# Patient Record
Sex: Female | Born: 1975
Health system: Southern US, Community
[De-identification: ages and names within clinical notes are randomized; demographics above are authoritative.]

## PROBLEM LIST (undated history)

## (undated) DIAGNOSIS — J45909 Unspecified asthma, uncomplicated: Secondary | ICD-10-CM

## (undated) DIAGNOSIS — B019 Varicella without complication: Secondary | ICD-10-CM

## (undated) DIAGNOSIS — R7611 Nonspecific reaction to tuberculin skin test without active tuberculosis: Secondary | ICD-10-CM

## (undated) HISTORY — DX: Varicella without complication: B01.9

## (undated) HISTORY — DX: Nonspecific reaction to tuberculin skin test without active tuberculosis: R76.11

## (undated) HISTORY — DX: Unspecified asthma, uncomplicated: J45.909

---

## 2002-10-26 ENCOUNTER — Ambulatory Visit (HOSPITAL_COMMUNITY): Admission: RE | Admit: 2002-10-26 | Discharge: 2002-10-26 | Payer: Self-pay | Admitting: *Deleted

## 2002-12-28 ENCOUNTER — Ambulatory Visit (HOSPITAL_COMMUNITY): Admission: RE | Admit: 2002-12-28 | Discharge: 2002-12-28 | Payer: Self-pay | Admitting: *Deleted

## 2003-02-21 ENCOUNTER — Encounter: Admission: RE | Admit: 2003-02-21 | Discharge: 2003-02-21 | Payer: Self-pay | Admitting: *Deleted

## 2003-03-05 ENCOUNTER — Encounter: Admission: RE | Admit: 2003-03-05 | Discharge: 2003-03-05 | Payer: Self-pay | Admitting: *Deleted

## 2003-03-07 ENCOUNTER — Ambulatory Visit (HOSPITAL_COMMUNITY): Admission: RE | Admit: 2003-03-07 | Discharge: 2003-03-07 | Payer: Self-pay | Admitting: *Deleted

## 2003-03-07 ENCOUNTER — Encounter: Admission: RE | Admit: 2003-03-07 | Discharge: 2003-03-07 | Payer: Self-pay | Admitting: *Deleted

## 2003-03-15 ENCOUNTER — Encounter: Admission: RE | Admit: 2003-03-15 | Discharge: 2003-03-15 | Payer: Self-pay | Admitting: *Deleted

## 2003-03-21 ENCOUNTER — Encounter: Admission: RE | Admit: 2003-03-21 | Discharge: 2003-03-21 | Payer: Self-pay | Admitting: *Deleted

## 2003-03-29 ENCOUNTER — Encounter: Admission: RE | Admit: 2003-03-29 | Discharge: 2003-03-29 | Payer: Self-pay | Admitting: *Deleted

## 2003-04-02 ENCOUNTER — Inpatient Hospital Stay (HOSPITAL_COMMUNITY): Admission: RE | Admit: 2003-04-02 | Discharge: 2003-04-02 | Payer: Self-pay | Admitting: *Deleted

## 2003-04-04 ENCOUNTER — Encounter: Admission: RE | Admit: 2003-04-04 | Discharge: 2003-04-04 | Payer: Self-pay | Admitting: *Deleted

## 2003-04-05 ENCOUNTER — Inpatient Hospital Stay (HOSPITAL_COMMUNITY): Admission: AD | Admit: 2003-04-05 | Discharge: 2003-04-08 | Payer: Self-pay | Admitting: *Deleted

## 2003-04-09 ENCOUNTER — Encounter: Admission: RE | Admit: 2003-04-09 | Discharge: 2003-05-09 | Payer: Self-pay | Admitting: *Deleted

## 2005-02-07 IMAGING — US US OB FOLLOW-UP
1 series · 13 of 28 positions shown · non-contrast
Comparison: none

CLINICAL DATA: Reevaluate growth.

[Series 1: unknown · 0.32mm/px · 13 of 34 slices shown]
[im 2/34]
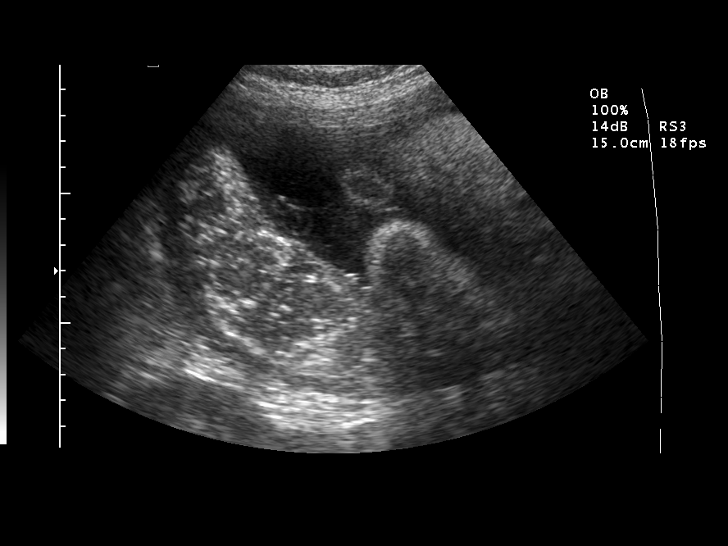
[im 4/34]
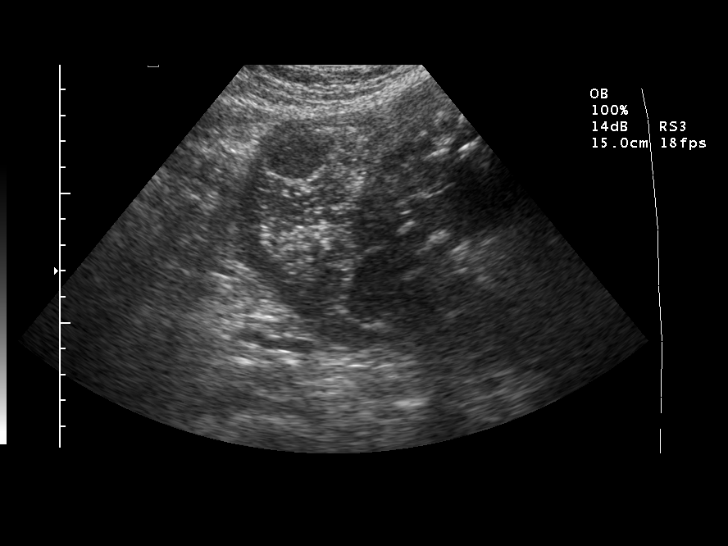
[im 7/34]
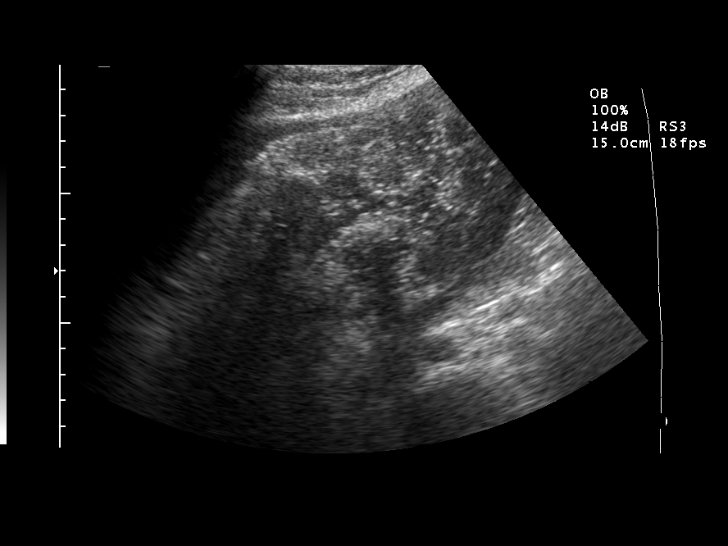
[im 9/34]
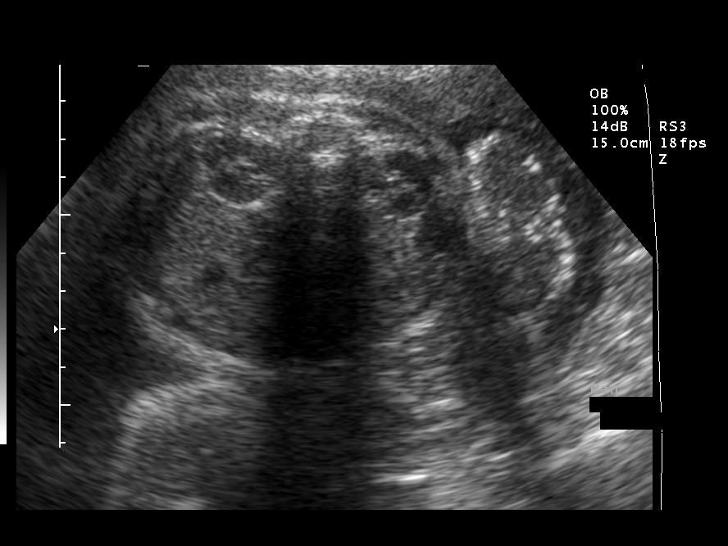
[im 12/34]
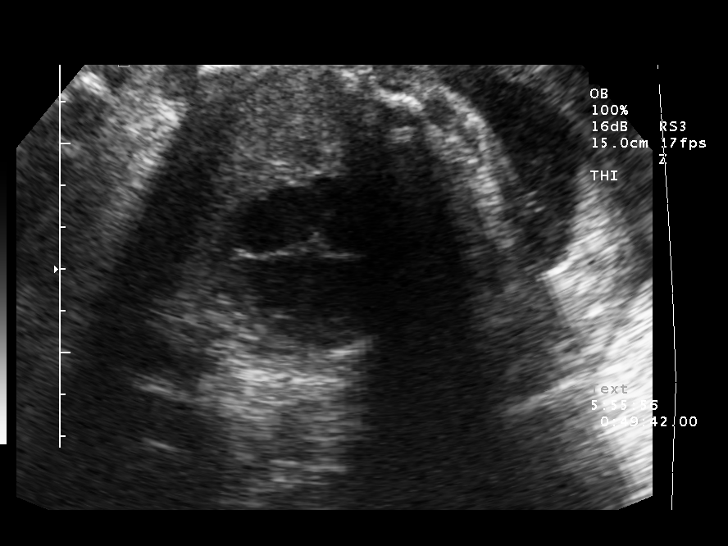
[im 14/34]
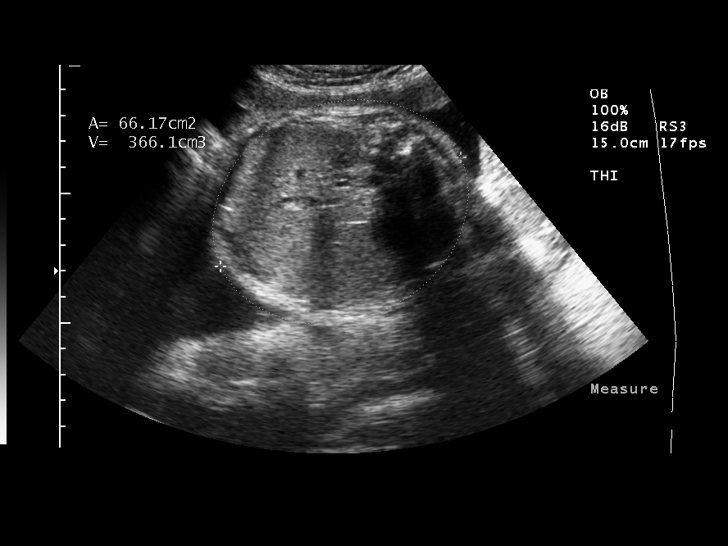
[im 18/34]
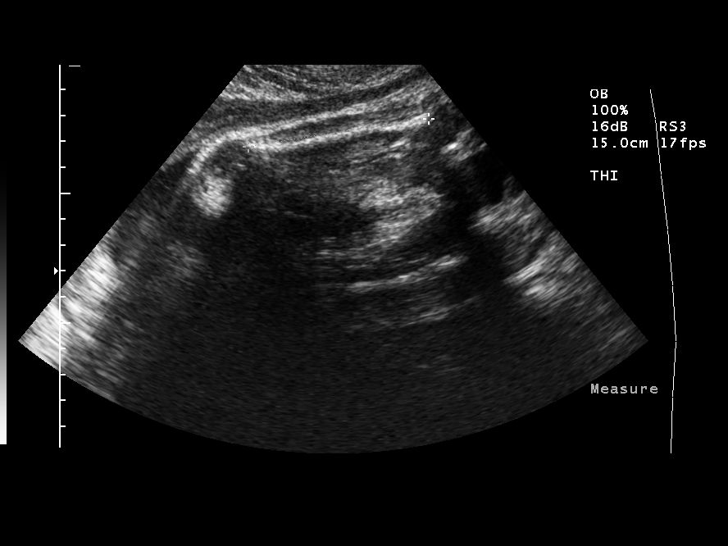
[im 20/34]
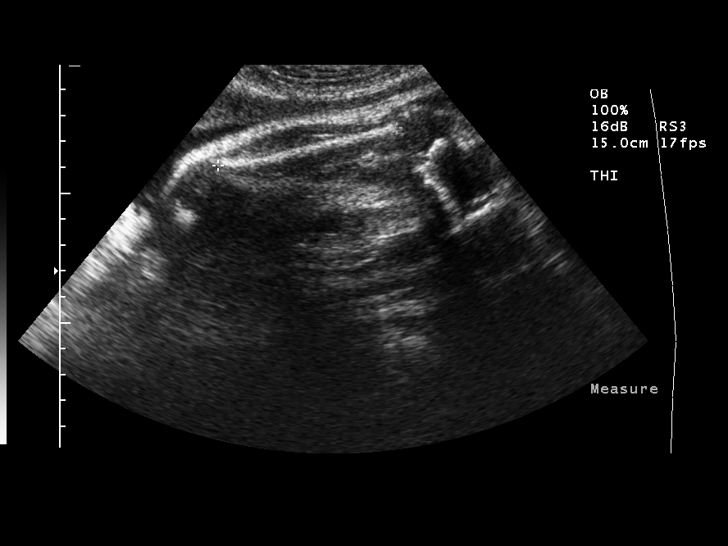
[im 23/34]
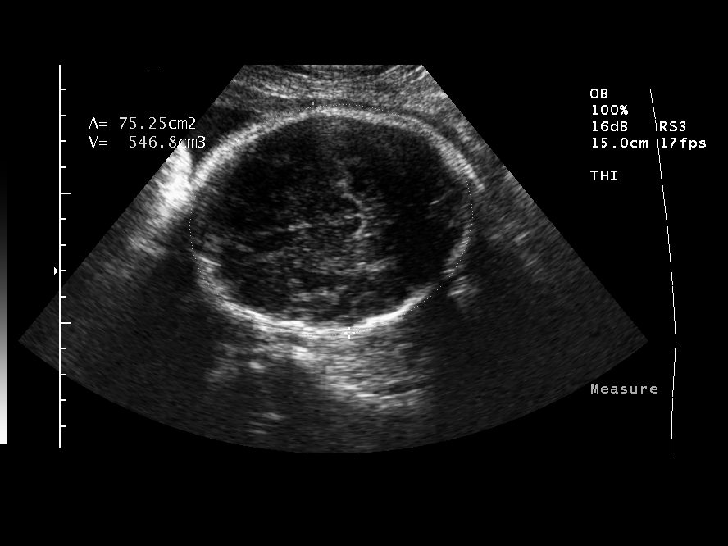
[im 25/34]
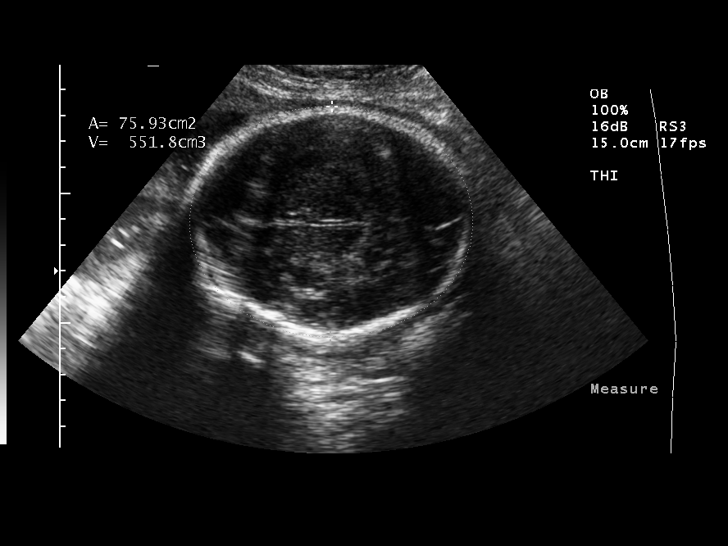
[im 27/34]
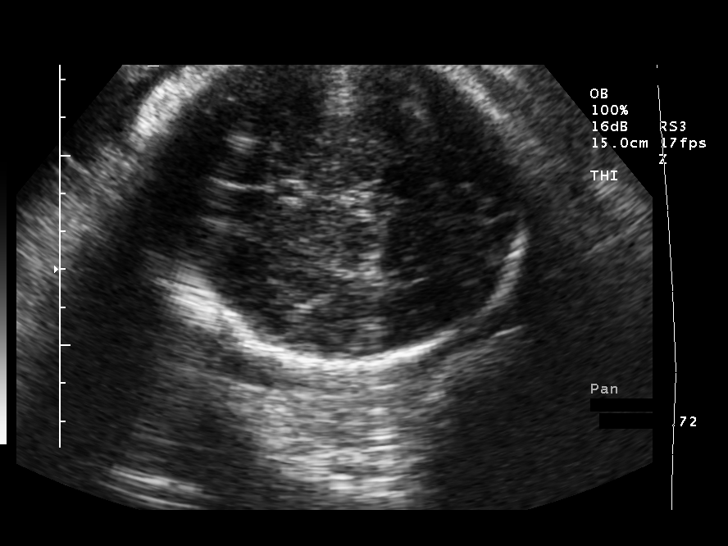
[im 30/34]
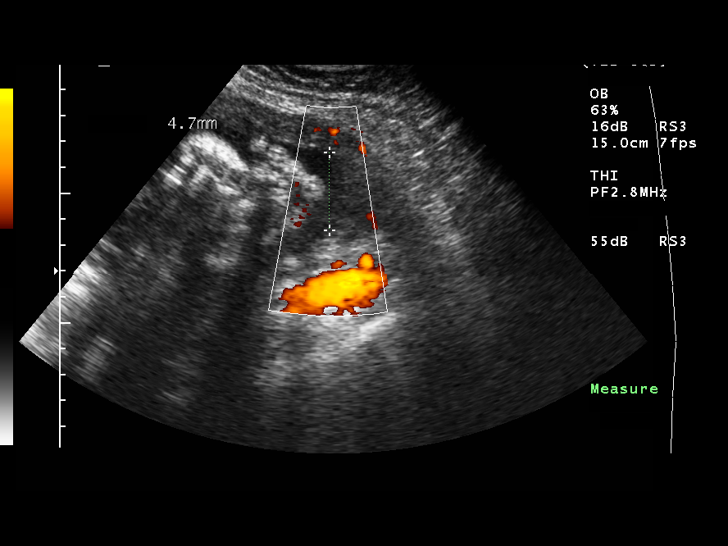
[im 32/34]
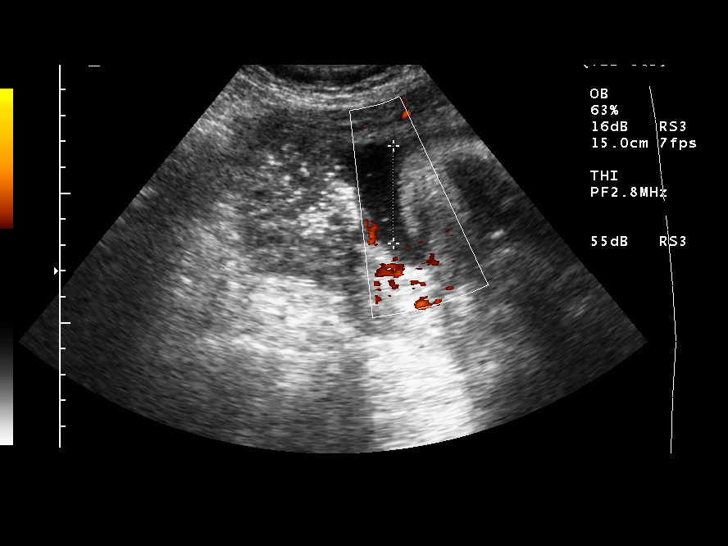

[13 of 28 positions shown; findings below may reference images not displayed]

OBSTETRICAL ULTRASOUND RE-EVALUATION
 Number of Fetuses:  1
 Heart Rate:  142
 Movement:  Yes
 Breathing:  Yes
 Presentation:  Cephalic
 Placental Location:  Fundal, posterior
 Grade:  III
 Previa:  No
 Amniotic Fluid (subjective):  Normal
 Amniotic Fluid (objective):  14.3 cm AFI (5th -95th%ile =   7.5 – 24.4 cm for 37 wks)

 FETAL BIOMETRY
 BPD:  8.7 cm   35 w 0 d
 HC:  31.2 cm   34 w 6 d
 AC:  29.2 cm   33 w 1 d
 FL:  7.1 cm   36 w 1 d

 Mean GA:  34 w 6 d
 Assigned GA:  36 w 6 d  (LMP)
 BPD/OFD:  .82 (0.70 – 0.86),  FL/BPD:  .81 (0.71 – 0.87), FL/AC:  .24 (0.20 – 0.24), HC/AC:  1.07 (.93 – 1.11)
 EFW:  7390 g (H) 10th – 25th%ile (5756 – 9069 g) For 37 wks

 FETAL ANATOMY
 Lateral Ventricles:    Visualized 
 Thalami/CSP:  Visualized 
 Posterior Fossa:  Previously seen 
 Nuchal Region:  N/A
 Spine:  Previously seen 
 4 Chamber Heart on Left:  Previously seen 
 Stomach on Left:  Visualized 
 3 Vessel Cord:  Visualized 
 Cord Insertion Site:  Previously seen 
 Kidneys:  Visualized 
 Bladder:  Visualized 
 Extremities:  Previously seen 

 Evaluation limited by:  Advanced gestational age 

 MATERNAL FINDINGS
 Cervix:  Not evaluated
IMPRESSION: Single intrauterine pregnancy demonstrating an estimated gestational age by ultrasound of 34 weeks and 6 days.  The abdominal circumference is smaller than the remaining gestational indicators with an absolute measurement of 29.2 cm (33 weeks 1 day).  The FL/AC ratio is at the upper limits of normal at .24 and the estimated fetal weight is 7390 grams.  This is between the 10th and 25th percentile, but closer to the lower end of the spectrum while on the prior exam the estimated fetal weight was between the 25th and 50th percentile, but close to the mid range suggesting the possibility of a greater than 25% fall-off in weight since the previous exam.  For this reason Doppler evaluation was performed.  
 Subjectively and quantitatively normal amniotic fluid. 
 No late developing fetal anatomic abnormalities are noted.
 DOPPLER ULTRASOUND OF FETUS

 Umbilical Artery S/D Ratio: 3.41  (NL < 3.23)

 Middle Cerebral Artery PI:  2.15  (NL > 1.15)
IMPRESSION: Slightly low umbilical artery S/D ratio with a normal middle cerebral artery doppler exam.
 Because of today’s findings, these study results were called to the [facility] Surend[facility].

## 2014-01-12 ENCOUNTER — Ambulatory Visit (INDEPENDENT_AMBULATORY_CARE_PROVIDER_SITE_OTHER): Payer: BC Managed Care – PPO | Admitting: Internal Medicine

## 2014-01-12 ENCOUNTER — Encounter: Payer: Self-pay | Admitting: Internal Medicine

## 2014-01-12 VITALS — BP 139/93 | HR 74 | Ht 62.0 in | Wt 144.7 lb

## 2014-01-12 DIAGNOSIS — R079 Chest pain, unspecified: Secondary | ICD-10-CM

## 2014-01-12 DIAGNOSIS — R0789 Other chest pain: Secondary | ICD-10-CM

## 2014-01-12 NOTE — Progress Notes (Signed)
    OFFICE NOTE  Chief Complaint:  Chest pain  Primary Care Physician: Jarrett SohoWharton, Courtney, PA-C  HPI:  Sherri Crosby is a pleasant 38 year old [place] female who is working at a [employer]. 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 She notes that when she goes in and out of the freezer that she comes out and has difficulty breathing and sharp chest pains mostly with cold weather. She also notes that when lifting boxes she gets some heaviness in her chest. She is noted sharp pains when vacuuming and sometimes does go to her back between the shoulder blades. The symptoms are very short-lived but do seem to improve with rest. There is a family history of heart disease in her mother who died of heart disease and her father had cirrhosis. She has no other active medical problems that are known and is not on medications.  PMHx:  History reviewed. No pertinent past medical history.  History reviewed. No pertinent past surgical history.  FAMHx:  Family History  Problem Relation Age of Onset  . Cirrhosis Father   . CAD Mother     SOCHx:   reports that she has never smoked. She has never used smokeless tobacco. She reports that she does not drink alcohol or use illicit drugs.  ALLERGIES:  Allergies  Allergen Reactions  . Avocado     ROS: A comprehensive review of systems was negative except for: Cardiovascular: positive for chest pain  HOME MEDS: No current outpatient prescriptions on file.   No current facility-administered medications for this visit.    LABS/IMAGING: No results found for this or any previous visit (from the past 48 hour(s)). No results found.  VITALS: BP 139/93  Pulse 74  Ht 5\' 2"  (1.575 m)  Wt 144 lb 11.2 oz (65.635 kg)  BMI 26.46 kg/m2  EXAM: General appearance: alert and no distress Neck: no carotid bruit and no JVD Lungs:  clear to auscultation bilaterally Heart: regular rate and rhythm, S1, S2 normal, no murmur, click, rub or gallop Abdomen: soft, non-tender; bowel sounds normal; no masses,  no organomegaly Extremities: extremities normal, atraumatic, no cyanosis or edema Pulses: 2+ and symmetric Skin: Skin color, texture, turgor normal. No rashes or lesions Neurologic: Grossly normal Psych: Normal  EKG: Normal sinus rhythm with sinus arrhythmia at 74  ASSESSMENT: 1. Atypical chest pain with a pleuritic component  PLAN: 1.   Sherri Crosby is describing a chest pain which has some relation to exertion but also is worse with cold weather and has a pleuritic component. This could indicate airway disease, possibly a reactive airway disease. She denies any wheezing or cough. Her pain does radiate somewhat to her back. I would recommend a treadmill exercise stress test to further evaluate for ischemia. We will contact her with the results of that study. If negative, further workup for possible pleuritic or chest wall pain is warranted.  Thanks so much for the kind referral.  Chrystie NoseKenneth C. Hilty, MD, [facility] Attending Cardiologist [facility]  HILTY,Kenneth C 01/12/2014, 5:00 PM

## 2014-01-12 NOTE — Patient Instructions (Signed)
Your physician has requested that you have an exercise tolerance test. For further information please visit [email or web address]. Please also follow instruction sheet, as given. >> we will call you with the test results   Your physician recommends that you schedule a follow-up appointment as needed.

## 2014-02-15 ENCOUNTER — Telehealth (HOSPITAL_COMMUNITY): Payer: Self-pay

## 2014-02-15 NOTE — Telephone Encounter (Signed)
Encounter complete. 

## 2014-02-16 ENCOUNTER — Telehealth (HOSPITAL_COMMUNITY): Payer: Self-pay

## 2014-02-16 NOTE — Telephone Encounter (Signed)
Encounter complete. 

## 2014-02-20 ENCOUNTER — Encounter (HOSPITAL_COMMUNITY): Payer: BC Managed Care – PPO

## 2014-12-11 ENCOUNTER — Ambulatory Visit: Payer: Self-pay | Admitting: Adult Health

## 2015-07-18 ENCOUNTER — Encounter: Payer: Self-pay | Admitting: Adult Health

## 2015-07-18 ENCOUNTER — Ambulatory Visit (INDEPENDENT_AMBULATORY_CARE_PROVIDER_SITE_OTHER): Payer: BLUE CROSS/BLUE SHIELD | Admitting: Adult Health

## 2015-07-18 VITALS — BP 140/80 | Temp 98.1°F | Wt 144.3 lb

## 2015-07-18 DIAGNOSIS — J4520 Mild intermittent asthma, uncomplicated: Secondary | ICD-10-CM

## 2015-07-18 DIAGNOSIS — Z23 Encounter for immunization: Secondary | ICD-10-CM

## 2015-07-18 DIAGNOSIS — Z7189 Other specified counseling: Secondary | ICD-10-CM

## 2015-07-18 DIAGNOSIS — Z7689 Persons encountering health services in other specified circumstances: Secondary | ICD-10-CM

## 2015-07-18 NOTE — Progress Notes (Signed)
Patient presents to clinic today to establish care. She does not have any significant past medical history.   She has never seen a PCP and has not had a physical     Acute Concerns: Establish Care   Chronic Issues: Asthma - It is well controlled currently. She does not use an inhaler.   Health Maintenance: Dental -- Does not see a dentist  Vision --Does not see  Immunizations --Tdap  Mammogram --Has not had one yet PAP -- Never  Diet: Does not eat healthy  Exercise: Does not exercise.   Past Medical History  Diagnosis Date  . Asthma     No past surgical history on file.  No current outpatient prescriptions on file prior to visit.   No current facility-administered medications on file prior to visit.    Allergies  Allergen Reactions  . Avocado     Family History  Problem Relation Age of Onset  . Cirrhosis Father   . Diabetes Father   . CAD Mother   . Diabetes Paternal Uncle   . Diabetes Paternal Grandmother     Social History   Social History  . Marital Status: Married    Spouse Name: N/A  . Number of Children: N/A  . Years of Education: N/A   Occupational History  . Not on file.   Social History Main Topics  . Smoking status: Never Smoker   . Smokeless tobacco: Never Used  . Alcohol Use: No  . Drug Use: No  . Sexual Activity: Not on file   Other Topics Concern  . Not on file   Social History Narrative    Review of Systems  Constitutional: Negative.   HENT: Negative.   Eyes: Negative.   Respiratory: Negative.   Cardiovascular: Negative.   Gastrointestinal: Negative.   Genitourinary: Negative.   Musculoskeletal: Negative.   Skin: Negative.   Neurological: Negative.   Endo/Heme/Allergies: Negative.   All other systems reviewed and are negative.   BP 140/80 mmHg  Temp(Src) 98.1 F (36.7 C) (Oral)  Wt 144 lb 4.8 oz (65.454 kg)  Physical Exam  Constitutional: She is oriented to person, place, and time and well-developed,  well-nourished, and in no distress. No distress.  obese  HENT:  Head: Normocephalic and atraumatic.  Right Ear: External ear normal.  Left Ear: External ear normal.  Nose: Nose normal.  Mouth/Throat: Oropharynx is clear and moist. No oropharyngeal exudate.  Eyes: Conjunctivae and EOM are normal. Pupils are equal, round, and reactive to light. Right eye exhibits no discharge. Left eye exhibits no discharge.  Cardiovascular: Normal rate, regular rhythm, normal heart sounds and intact distal pulses.  Exam reveals no gallop and no friction rub.   No murmur heard. Pulmonary/Chest: Effort normal and breath sounds normal. No respiratory distress. She has no wheezes. She has no rales. She exhibits no tenderness.  Neurological: She is alert and oriented to person, place, and time. Gait normal. GCS score is 15.  Skin: Skin is warm and dry. No rash noted. She is not diaphoretic. No erythema. No pallor.  Psychiatric: Mood, memory, affect and judgment normal.  Nursing note and vitals reviewed.    Assessment/Plan: 1. Encounter to establish care - Follow up for CPE and PAP - Follow up sooner if needed - Needs to see a dentist - Work on diet and exercise.   2. Asthma, mild intermittent, uncomplicated - Controlled.   3. Need for diphtheria-tetanus-pertussis (Tdap) vaccine, adult/adolescent - Tdap vaccine greater than or equal  to 7yo IM  Shirline Freesory Cindi Ghazarian, NP

## 2015-07-18 NOTE — Patient Instructions (Addendum)
It was great meeting you today!  Please follow up with me for your physical.   If you need anything in the meantime, please let me know  Health Maintenance, Female Adopting a healthy lifestyle and getting preventive care can go a long way to promote health and wellness. Talk with your health care provider about what schedule of regular examinations is right for you. This is a good chance for you to check in with your provider about disease prevention and staying healthy. In between checkups, there are plenty of things you can do on your own. Experts have done a lot of research about which lifestyle changes and preventive measures are most likely to keep you healthy. Ask your health care provider for more information. WEIGHT AND DIET  Eat a healthy diet  Be sure to include plenty of vegetables, fruits, low-fat dairy products, and lean protein.  Do not eat a lot of foods high in solid fats, added sugars, or salt.  Get regular exercise. This is one of the most important things you can do for your health.  Most adults should exercise for at least 150 minutes each week. The exercise should increase your heart rate and make you sweat (moderate-intensity exercise).  Most adults should also do strengthening exercises at least twice a week. This is in addition to the moderate-intensity exercise.  Maintain a healthy weight  Body mass index (BMI) is a measurement that can be used to identify possible weight problems. It estimates body fat based on height and weight. Your health care provider can help determine your BMI and help you achieve or maintain a healthy weight.  For females 20 years of age and older:   A BMI below 18.5 is considered underweight.  A BMI of 18.5 to 24.9 is normal.  A BMI of 25 to 29.9 is considered overweight.  A BMI of 30 and above is considered obese.  Watch levels of cholesterol and blood lipids  You should start having your blood tested for lipids and  cholesterol at 40 years of age, then have this test every 5 years.  You may need to have your cholesterol levels checked more often if:  Your lipid or cholesterol levels are high.  You are older than 40 years of age.  You are at high risk for heart disease.  CANCER SCREENING   Lung Cancer  Lung cancer screening is recommended for adults 55-80 years old who are at high risk for lung cancer because of a history of smoking.  A yearly low-dose CT scan of the lungs is recommended for people who:  Currently smoke.  Have quit within the past 15 years.  Have at least a 30-pack-year history of smoking. A pack year is smoking an average of one pack of cigarettes a day for 1 year.  Yearly screening should continue until it has been 15 years since you quit.  Yearly screening should stop if you develop a health problem that would prevent you from having lung cancer treatment.  Breast Cancer  Practice breast self-awareness. This means understanding how your breasts normally appear and feel.  It also means doing regular breast self-exams. Let your health care provider know about any changes, no matter how small.  If you are in your 20s or 30s, you should have a clinical breast exam (CBE) by a health care provider every 1-3 years as part of a regular health exam.  If you are 40 or older, have a CBE every year. Also consider   consider having a breast X-ray (mammogram) every year.  If you have a family history of breast cancer, talk to your health care provider about genetic screening.  If you are at high risk for breast cancer, talk to your health care provider about having an MRI and a mammogram every year.  Breast cancer gene (BRCA) assessment is recommended for women who have family members with BRCA-related cancers. BRCA-related cancers include:  Breast.  Ovarian.  Tubal.  Peritoneal cancers.  Results of the assessment will determine the need for genetic counseling and BRCA1 and BRCA2  testing. Cervical Cancer Your health care provider may recommend that you be screened regularly for cancer of the pelvic organs (ovaries, uterus, and vagina). This screening involves a pelvic examination, including checking for microscopic changes to the surface of your cervix (Pap test). You may be encouraged to have this screening done every 3 years, beginning at age 31.  For women ages 67-65, health care providers may recommend pelvic exams and Pap testing every 3 years, or they may recommend the Pap and pelvic exam, combined with testing for human papilloma virus (HPV), every 5 years. Some types of HPV increase your risk of cervical cancer. Testing for HPV may also be done on women of any age with unclear Pap test results.  Other health care providers may not recommend any screening for nonpregnant women who are considered low risk for pelvic cancer and who do not have symptoms. Ask your health care provider if a screening pelvic exam is right for you.  If you have had past treatment for cervical cancer or a condition that could lead to cancer, you need Pap tests and screening for cancer for at least 20 years after your treatment. If Pap tests have been discontinued, your risk factors (such as having a new sexual partner) need to be reassessed to determine if screening should resume. Some women have medical problems that increase the chance of getting cervical cancer. In these cases, your health care provider may recommend more frequent screening and Pap tests. Colorectal Cancer  This type of cancer can be detected and often prevented.  Routine colorectal cancer screening usually begins at 40 years of age and continues through 41 years of age.  Your health care provider may recommend screening at an earlier age if you have risk factors for colon cancer.  Your health care provider may also recommend using home test kits to check for hidden blood in the stool.  A small camera at the end of a  tube can be used to examine your colon directly (sigmoidoscopy or colonoscopy). This is done to check for the earliest forms of colorectal cancer.  Routine screening usually begins at age 25.  Direct examination of the colon should be repeated every 5-10 years through 41 years of age. However, you may need to be screened more often if early forms of precancerous polyps or small growths are found. Skin Cancer  Check your skin from head to toe regularly.  Tell your health care provider about any new moles or changes in moles, especially if there is a change in a mole's shape or color.  Also tell your health care provider if you have a mole that is larger than the size of a pencil eraser.  Always use sunscreen. Apply sunscreen liberally and repeatedly throughout the day.  Protect yourself by wearing long sleeves, pants, a wide-brimmed hat, and sunglasses whenever you are outside. HEART DISEASE, DIABETES, AND HIGH BLOOD PRESSURE   High  blood pressure causes heart disease and increases the risk of stroke. High blood pressure is more likely to develop in:  People who have blood pressure in the high end of the normal range (130-139/85-89 mm Hg).  People who are overweight or obese.  People who are African American.  If you are 30-90 years of age, have your blood pressure checked every 3-5 years. If you are 83 years of age or older, have your blood pressure checked every year. You should have your blood pressure measured twice--once when you are at a hospital or clinic, and once when you are not at a hospital or clinic. Record the average of the two measurements. To check your blood pressure when you are not at a hospital or clinic, you can use:  An automated blood pressure machine at a pharmacy.  A home blood pressure monitor.  If you are between 66 years and 43 years old, ask your health care provider if you should take aspirin to prevent strokes.  Have regular diabetes screenings. This  involves taking a blood sample to check your fasting blood sugar level.  If you are at a normal weight and have a low risk for diabetes, have this test once every three years after 40 years of age.  If you are overweight and have a high risk for diabetes, consider being tested at a younger age or more often. PREVENTING INFECTION  Hepatitis B  If you have a higher risk for hepatitis B, you should be screened for this virus. You are considered at high risk for hepatitis B if:  You were born in a country where hepatitis B is common. Ask your health care provider which countries are considered high risk.  Your parents were born in a high-risk country, and you have not been immunized against hepatitis B (hepatitis B vaccine).  You have HIV or AIDS.  You use needles to inject street drugs.  You live with someone who has hepatitis B.  You have had sex with someone who has hepatitis B.  You get hemodialysis treatment.  You take certain medicines for conditions, including cancer, organ transplantation, and autoimmune conditions. Hepatitis C  Blood testing is recommended for:  Everyone born from [birth year] through 1965.  Anyone with known risk factors for hepatitis C. Sexually transmitted infections (STIs)  You should be screened for sexually transmitted infections (STIs) including gonorrhea and chlamydia if:  You are sexually active and are younger than 40 years of age.  You are older than 40 years of age and your health care provider tells you that you are at risk for this type of infection.  Your sexual activity has changed since you were last screened and you are at an increased risk for chlamydia or gonorrhea. Ask your health care provider if you are at risk.  If you do not have HIV, but are at risk, it may be recommended that you take a prescription medicine daily to prevent HIV infection. This is called pre-exposure prophylaxis (PrEP). You are considered at risk if:  You are  sexually active and do not regularly use condoms or know the HIV status of your partner(s).  You take drugs by injection.  You are sexually active with a partner who has HIV. Talk with your health care provider about whether you are at high risk of being infected with HIV. If you choose to begin PrEP, you should first be tested for HIV. You should then be tested every 3 months for as long  as you are taking PrEP.  PREGNANCY   If you are premenopausal and you may become pregnant, ask your health care provider about preconception counseling.  If you may become pregnant, take 400 to 800 micrograms (mcg) of folic acid every day.  If you want to prevent pregnancy, talk to your health care provider about birth control (contraception). OSTEOPOROSIS AND MENOPAUSE   Osteoporosis is a disease in which the bones lose minerals and strength with aging. This can result in serious bone fractures. Your risk for osteoporosis can be identified using a bone density scan.  If you are 65 years of age or older, or if you are at risk for osteoporosis and fractures, ask your health care provider if you should be screened.  Ask your health care provider whether you should take a calcium or vitamin D supplement to lower your risk for osteoporosis.  Menopause may have certain physical symptoms and risks.  Hormone replacement therapy may reduce some of these symptoms and risks. Talk to your health care provider about whether hormone replacement therapy is right for you.  HOME CARE INSTRUCTIONS   Schedule regular health, dental, and eye exams.  Stay current with your immunizations.   Do not use any tobacco products including cigarettes, chewing tobacco, or electronic cigarettes.  If you are pregnant, do not drink alcohol.  If you are breastfeeding, limit how much and how often you drink alcohol.  Limit alcohol intake to no more than 1 drink per day for nonpregnant women. One drink equals 12 ounces of beer, 5  ounces of wine, or 1 ounces of hard liquor.  Do not use street drugs.  Do not share needles.  Ask your health care provider for help if you need support or information about quitting drugs.  Tell your health care provider if you often feel depressed.  Tell your health care provider if you have ever been abused or do not feel safe at home.   This information is not intended to replace advice given to you by your health care provider. Make sure you discuss any questions you have with your health care provider.   Document Released: 09/15/2010 Document Revised: 03/23/2014 Document Reviewed: 02/01/2013 Elsevier Interactive Patient Education 2016 [employer]  

## 2015-09-04 ENCOUNTER — Other Ambulatory Visit (INDEPENDENT_AMBULATORY_CARE_PROVIDER_SITE_OTHER): Payer: BLUE CROSS/BLUE SHIELD

## 2015-09-04 DIAGNOSIS — Z0000 Encounter for general adult medical examination without abnormal findings: Secondary | ICD-10-CM | POA: Diagnosis not present

## 2015-09-04 LAB — CBC WITH DIFFERENTIAL/PLATELET
BASOS ABS: 0 10*3/uL (ref 0.0–0.1)
BASOS PCT: 0.6 % (ref 0.0–3.0)
EOS ABS: 0.2 10*3/uL (ref 0.0–0.7)
EOS PCT: 3.2 % (ref 0.0–5.0)
HEMATOCRIT: 31.6 % — AB (ref 36.0–46.0)
Hemoglobin: 10.1 g/dL — ABNORMAL LOW (ref 12.0–15.0)
LYMPHS ABS: 2.4 10*3/uL (ref 0.7–4.0)
LYMPHS PCT: 41.2 % (ref 12.0–46.0)
MCHC: 31.8 g/dL (ref 30.0–36.0)
MONOS PCT: 8.5 % (ref 3.0–12.0)
Monocytes Absolute: 0.5 10*3/uL (ref 0.1–1.0)
NEUTROS ABS: 2.7 10*3/uL (ref 1.4–7.7)
NEUTROS PCT: 46.5 % (ref 43.0–77.0)
PLATELETS: 218 10*3/uL (ref 150.0–400.0)
RBC: 4.56 Mil/uL (ref 3.87–5.11)
RDW: 16 % — AB (ref 11.5–15.5)
WBC: 5.9 10*3/uL (ref 4.0–10.5)

## 2015-09-04 LAB — BASIC METABOLIC PANEL
BUN: 13 mg/dL (ref 6–23)
CHLORIDE: 105 meq/L (ref 96–112)
CO2: 27 mEq/L (ref 19–32)
Calcium: 9.4 mg/dL (ref 8.4–10.5)
Creatinine, Ser: 0.48 mg/dL (ref 0.40–1.20)
GFR: 151.95 mL/min (ref >60.00)
Glucose, Bld: 99 mg/dL (ref 70–99)
POTASSIUM: 4.2 meq/L (ref 3.5–5.1)
SODIUM: 138 meq/L (ref 135–145)

## 2015-09-04 LAB — HEPATIC FUNCTION PANEL
ALBUMIN: 3.7 g/dL (ref 3.5–5.2)
ALK PHOS: 56 U/L (ref 39–117)
ALT: 19 U/L (ref 0–35)
AST: 19 U/L (ref 0–37)
BILIRUBIN DIRECT: 0.1 mg/dL (ref 0.0–0.3)
TOTAL PROTEIN: 7.5 g/dL (ref 6.0–8.3)
Total Bilirubin: 0.5 mg/dL (ref 0.2–1.2)

## 2015-09-04 LAB — POC URINALSYSI DIPSTICK (AUTOMATED)
BILIRUBIN UA: NEGATIVE
Blood, UA: NEGATIVE
GLUCOSE UA: NEGATIVE
Ketones, UA: NEGATIVE
NITRITE UA: NEGATIVE
Protein, UA: NEGATIVE
Spec Grav, UA: 1.025
UROBILINOGEN UA: 0.2
pH, UA: 6.5

## 2015-09-04 LAB — LIPID PANEL
Cholesterol: 204 mg/dL — ABNORMAL HIGH (ref 0–200)
HDL: 60 mg/dL (ref >39.00)
LDL Cholesterol: 129 mg/dL — ABNORMAL HIGH (ref 0–99)
NONHDL: 144.19
TRIGLYCERIDES: 78 mg/dL (ref 0.0–149.0)
Total CHOL/HDL Ratio: 3
VLDL: 15.6 mg/dL (ref 0.0–40.0)

## 2015-09-04 LAB — TSH: TSH: 3.42 u[IU]/mL (ref 0.35–4.50)

## 2015-09-11 ENCOUNTER — Ambulatory Visit (INDEPENDENT_AMBULATORY_CARE_PROVIDER_SITE_OTHER): Payer: BLUE CROSS/BLUE SHIELD | Admitting: Adult Health

## 2015-09-11 ENCOUNTER — Encounter: Payer: Self-pay | Admitting: Adult Health

## 2015-09-11 VITALS — BP 140/82 | Temp 98.1°F | Ht 62.0 in | Wt 149.1 lb

## 2015-09-11 DIAGNOSIS — Z021 Encounter for pre-employment examination: Secondary | ICD-10-CM | POA: Diagnosis not present

## 2015-09-11 DIAGNOSIS — Z111 Encounter for screening for respiratory tuberculosis: Secondary | ICD-10-CM | POA: Diagnosis not present

## 2015-09-11 DIAGNOSIS — Z1239 Encounter for other screening for malignant neoplasm of breast: Secondary | ICD-10-CM | POA: Diagnosis not present

## 2015-09-11 DIAGNOSIS — Z0000 Encounter for general adult medical examination without abnormal findings: Secondary | ICD-10-CM | POA: Diagnosis not present

## 2015-09-11 DIAGNOSIS — D508 Other iron deficiency anemias: Secondary | ICD-10-CM

## 2015-09-11 NOTE — Patient Instructions (Addendum)
It was great seeing you today!  Your exam was great!  Cholesterol was a little high, diet and exercise will help with this.   Your iron level is also a little low, an over the counter iron supplement will correct this.   Follow up on Friday to have your TB test read.     Health Maintenance, Female Adopting a healthy lifestyle and getting preventive care can go a long way to promote health and wellness. Talk with your health care provider about what schedule of regular examinations is right for you. This is a good chance for you to check in with your provider about disease prevention and staying healthy. In between checkups, there are plenty of things you can do on your own. Experts have done a lot of research about which lifestyle changes and preventive measures are most likely to keep you healthy. Ask your health care provider for more information. WEIGHT AND DIET  Eat a healthy diet  Be sure to include plenty of vegetables, fruits, low-fat dairy products, and lean protein.  Do not eat a lot of foods high in solid fats, added sugars, or salt.  Get regular exercise. This is one of the most important things you can do for your health.  Most adults should exercise for at least 150 minutes each week. The exercise should increase your heart rate and make you sweat (moderate-intensity exercise).  Most adults should also do strengthening exercises at least twice a week. This is in addition to the moderate-intensity exercise.  Maintain a healthy weight  Body mass index (BMI) is a measurement that can be used to identify possible weight problems. It estimates body fat based on height and weight. Your health care provider can help determine your BMI and help you achieve or maintain a healthy weight.  For females 51 years of age and older:   A BMI below 18.5 is considered underweight.  A BMI of 18.5 to 24.9 is normal.  A BMI of 25 to 29.9 is considered overweight.  A BMI of 30 and  above is considered obese.  Watch levels of cholesterol and blood lipids  You should start having your blood tested for lipids and cholesterol at 40 years of age, then have this test every 5 years.  You may need to have your cholesterol levels checked more often if:  Your lipid or cholesterol levels are high.  You are older than 40 years of age.  You are at high risk for heart disease.  CANCER SCREENING   Lung Cancer  Lung cancer screening is recommended for adults 46-24 years old who are at high risk for lung cancer because of a history of smoking.  A yearly low-dose CT scan of the lungs is recommended for people who:  Currently smoke.  Have quit within the past 15 years.  Have at least a 30-pack-year history of smoking. A pack year is smoking an average of one pack of cigarettes a day for 1 year.  Yearly screening should continue until it has been 15 years since you quit.  Yearly screening should stop if you develop a health problem that would prevent you from having lung cancer treatment.  Breast Cancer  Practice breast self-awareness. This means understanding how your breasts normally appear and feel.  It also means doing regular breast self-exams. Let your health care provider know about any changes, no matter how small.  If you are in your 20s or 30s, you should have a clinical breast exam (  CBE) by a health care provider every 1-3 years as part of a regular health exam.  If you are 61 or older, have a CBE every year. Also consider having a breast X-ray (mammogram) every year.  If you have a family history of breast cancer, talk to your health care provider about genetic screening.  If you are at high risk for breast cancer, talk to your health care provider about having an MRI and a mammogram every year.  Breast cancer gene (BRCA) assessment is recommended for women who have family members with BRCA-related cancers. BRCA-related cancers  include:  Breast.  Ovarian.  Tubal.  Peritoneal cancers.  Results of the assessment will determine the need for genetic counseling and BRCA1 and BRCA2 testing. Cervical Cancer Your health care provider may recommend that you be screened regularly for cancer of the pelvic organs (ovaries, uterus, and vagina). This screening involves a pelvic examination, including checking for microscopic changes to the surface of your cervix (Pap test). You may be encouraged to have this screening done every 3 years, beginning at age 86.  For women ages 65-65, health care providers may recommend pelvic exams and Pap testing every 3 years, or they may recommend the Pap and pelvic exam, combined with testing for human papilloma virus (HPV), every 5 years. Some types of HPV increase your risk of cervical cancer. Testing for HPV may also be done on women of any age with unclear Pap test results.  Other health care providers may not recommend any screening for nonpregnant women who are considered low risk for pelvic cancer and who do not have symptoms. Ask your health care provider if a screening pelvic exam is right for you.  If you have had past treatment for cervical cancer or a condition that could lead to cancer, you need Pap tests and screening for cancer for at least 20 years after your treatment. If Pap tests have been discontinued, your risk factors (such as having a new sexual partner) need to be reassessed to determine if screening should resume. Some women have medical problems that increase the chance of getting cervical cancer. In these cases, your health care provider may recommend more frequent screening and Pap tests. Colorectal Cancer  This type of cancer can be detected and often prevented.  Routine colorectal cancer screening usually begins at 40 years of age and continues through 40 years of age.  Your health care provider may recommend screening at an earlier age if you have risk factors for  colon cancer.  Your health care provider may also recommend using home test kits to check for hidden blood in the stool.  A small camera at the end of a tube can be used to examine your colon directly (sigmoidoscopy or colonoscopy). This is done to check for the earliest forms of colorectal cancer.  Routine screening usually begins at age 29.  Direct examination of the colon should be repeated every 5-10 years through 40 years of age. However, you may need to be screened more often if early forms of precancerous polyps or small growths are found. Skin Cancer  Check your skin from head to toe regularly.  Tell your health care provider about any new moles or changes in moles, especially if there is a change in a mole's shape or color.  Also tell your health care provider if you have a mole that is larger than the size of a pencil eraser.  Always use sunscreen. Apply sunscreen liberally and repeatedly throughout  the day.  Protect yourself by wearing long sleeves, pants, a wide-brimmed hat, and sunglasses whenever you are outside. HEART DISEASE, DIABETES, AND HIGH BLOOD PRESSURE   High blood pressure causes heart disease and increases the risk of stroke. High blood pressure is more likely to develop in:  People who have blood pressure in the high end of the normal range (130-139/85-89 mm Hg).  People who are overweight or obese.  People who are African American.  If you are 18-39 years of age, have your blood pressure checked every 3-5 years. If you are 40 years of age or older, have your blood pressure checked every year. You should have your blood pressure measured twice--once when you are at a hospital or clinic, and once when you are not at a hospital or clinic. Record the average of the two measurements. To check your blood pressure when you are not at a hospital or clinic, you can use:  An automated blood pressure machine at a pharmacy.  A home blood pressure monitor.  If you  are between 55 years and 79 years old, ask your health care provider if you should take aspirin to prevent strokes.  Have regular diabetes screenings. This involves taking a blood sample to check your fasting blood sugar level.  If you are at a normal weight and have a low risk for diabetes, have this test once every three years after 40 years of age.  If you are overweight and have a high risk for diabetes, consider being tested at a younger age or more often. PREVENTING INFECTION  Hepatitis B  If you have a higher risk for hepatitis B, you should be screened for this virus. You are considered at high risk for hepatitis B if:  You were born in a country where hepatitis B is common. Ask your health care provider which countries are considered high risk.  Your parents were born in a high-risk country, and you have not been immunized against hepatitis B (hepatitis B vaccine).  You have HIV or AIDS.  You use needles to inject street drugs.  You live with someone who has hepatitis B.  You have had sex with someone who has hepatitis B.  You get hemodialysis treatment.  You take certain medicines for conditions, including cancer, organ transplantation, and autoimmune conditions. Hepatitis C  Blood testing is recommended for:  Everyone born from 1945 through 1965.  Anyone with known risk factors for hepatitis C. Sexually transmitted infections (STIs)  You should be screened for sexually transmitted infections (STIs) including gonorrhea and chlamydia if:  You are sexually active and are younger than 40 years of age.  You are older than 40 years of age and your health care provider tells you that you are at risk for this type of infection.  Your sexual activity has changed since you were last screened and you are at an increased risk for chlamydia or gonorrhea. Ask your health care provider if you are at risk.  If you do not have HIV, but are at risk, it may be recommended that you  take a prescription medicine daily to prevent HIV infection. This is called pre-exposure prophylaxis (PrEP). You are considered at risk if:  You are sexually active and do not regularly use condoms or know the HIV status of your partner(s).  You take drugs by injection.  You are sexually active with a partner who has HIV. Talk with your health care provider about whether you are at high risk   of being infected with HIV. If you choose to begin PrEP, you should first be tested for HIV. You should then be tested every 3 months for as long as you are taking PrEP.  PREGNANCY   If you are premenopausal and you may become pregnant, ask your health care provider about preconception counseling.  If you may become pregnant, take 400 to 800 micrograms (mcg) of folic acid every day.  If you want to prevent pregnancy, talk to your health care provider about birth control (contraception). OSTEOPOROSIS AND MENOPAUSE   Osteoporosis is a disease in which the bones lose minerals and strength with aging. This can result in serious bone fractures. Your risk for osteoporosis can be identified using a bone density scan.  If you are 65 years of age or older, or if you are at risk for osteoporosis and fractures, ask your health care provider if you should be screened.  Ask your health care provider whether you should take a calcium or vitamin D supplement to lower your risk for osteoporosis.  Menopause may have certain physical symptoms and risks.  Hormone replacement therapy may reduce some of these symptoms and risks. Talk to your health care provider about whether hormone replacement therapy is right for you.  HOME CARE INSTRUCTIONS   Schedule regular health, dental, and eye exams.  Stay current with your immunizations.   Do not use any tobacco products including cigarettes, chewing tobacco, or electronic cigarettes.  If you are pregnant, do not drink alcohol.  If you are breastfeeding, limit how  much and how often you drink alcohol.  Limit alcohol intake to no more than 1 drink per day for nonpregnant women. One drink equals 12 ounces of beer, 5 ounces of wine, or 1 ounces of hard liquor.  Do not use street drugs.  Do not share needles.  Ask your health care provider for help if you need support or information about quitting drugs.  Tell your health care provider if you often feel depressed.  Tell your health care provider if you have ever been abused or do not feel safe at home.   This information is not intended to replace advice given to you by your health care provider. Make sure you discuss any questions you have with your health care provider.   Document Released: 09/15/2010 Document Revised: 03/23/2014 Document Reviewed: 02/01/2013 Elsevier Interactive Patient Education 2016 Elsevier Inc.  

## 2015-09-11 NOTE — Progress Notes (Signed)
Subjective:    Patient ID: Sherri Crosby, female    DOB: November 22, 1975, 40 y.o.   MRN: [id number]  HPI  Patient presents for yearly preventative medicine examination.She is a pleasant 40 year old [place] female who  has a past medical history of Asthma and Chicken pox.  All immunizations and health maintenance protocols were reviewed with the patient and needed orders were placed.  Medication reconciliation,  past medical history, social history, problem list and allergies were reviewed in detail with the patient  Goals were established with regard to weight loss, exercise, and  diet in compliance with medications  She has no acute complaints today but recently has gotten a job with [employer] and needs a work physical done as well.   She is not up-to-date on her dental visits or mammogram. She does not do self breast exams  She has been to the eye doctor.    Review of Systems  Constitutional: Negative.   HENT: Negative.   Eyes: Negative.   Respiratory: Negative.   Cardiovascular: Negative.   Gastrointestinal: Negative.   Endocrine: Negative.   Genitourinary: Negative.   Musculoskeletal: Negative.   Skin: Negative.   Allergic/Immunologic: Negative.   Neurological: Negative.   Hematological: Negative.   Psychiatric/Behavioral: Negative.   All other systems reviewed and are negative.  Past Medical History  Diagnosis Date  . Asthma   . Chicken pox     Social History   Social History  . Marital Status: Married    Spouse Name: N/A  . Number of Children: N/A  . Years of Education: N/A   Occupational History  . Not on file.   Social History Main Topics  . Smoking status: Never Smoker   . Smokeless tobacco: Never Used  . Alcohol Use: No  . Drug Use: No  . Sexual Activity: Not on file   Other Topics Concern  . 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  Family History  Problem Relation Age of Onset  . Cirrhosis Father   . Diabetes Father   . CAD Mother   . Diabetes Paternal Uncle   . Diabetes Paternal Grandmother     Allergies  Allergen Reactions  . Avocado     No current outpatient prescriptions on file prior to visit.   No current facility-administered medications on file prior to visit.    BP 140/82 mmHg  Temp(Src) 98.1 F (36.7 C) (Oral)  Ht 5\' 2"  (1.575 m)  Wt 149 lb 1.6 oz (67.631 kg)  BMI 27.26 kg/m2       Objective:   Physical Exam  Constitutional: She is oriented to person, place, and time. She appears well-developed and well-nourished. No distress.  HENT:  Head: Normocephalic and atraumatic.  Right Ear: External ear normal.  Left Ear: External ear normal.  Nose: Nose normal.  Mouth/Throat: Oropharynx is clear and moist. No oropharyngeal exudate.  Eyes: Conjunctivae are normal. Right eye exhibits no discharge. Left eye exhibits no discharge.  Neck: Normal range of motion. Neck supple. No JVD present. No tracheal deviation present. No thyromegaly present.  Cardiovascular: Normal rate, regular rhythm, normal heart sounds and intact distal pulses.  Exam reveals no gallop and no friction rub.   No murmur heard. Pulmonary/Chest: Effort normal and breath sounds normal. No stridor.  No respiratory distress. She has no wheezes. She has no rales. She exhibits no tenderness.  Abdominal: Soft. Bowel sounds are normal. She exhibits no distension and no mass. There is no tenderness. There is no rebound and no guarding.  Musculoskeletal: Normal range of motion.  Lymphadenopathy:    She has no cervical adenopathy.  Neurological: She is alert and oriented to person, place, and time. She has normal reflexes. No cranial nerve deficit. Coordination normal.  Skin: Skin is warm and dry. No rash noted. No erythema. No pallor.  Psychiatric: She has a normal mood and affect. Her behavior is normal.  Judgment and thought content normal.  Nursing note and vitals reviewed.     Assessment & Plan:  1. Routine general medical examination at a health care facility - Reviewed labs in detail with the patient. Cholesterol level is slightly high as well as her LDL. I advised that she change her diet and start exercising more. - Follow-up in 1 year for next CPE - Followup sooner if needed - Continue to eat healthy - Increase exercise  2. Breast cancer screening - Educated on the importance of doing monthly self breast exams - MM DIGITAL SCREENING BILATERAL; Future  3. Pre-employment health screening examination  - Measles/Mumps/Rubella Immunity - Hep B Surface Antibody - Hep B Surface Antigen - TB Skin Test  4. Other iron deficiency anemias - Advised oral iron supplement - He is asymptomatic we'll retest next physical  Shirline Freesory Dystany Duffy, NP

## 2015-09-13 ENCOUNTER — Ambulatory Visit: Payer: BLUE CROSS/BLUE SHIELD

## 2015-09-13 LAB — TB SKIN TEST
Induration: 10 mm
TB Skin Test: POSITIVE

## 2015-09-13 NOTE — Addendum Note (Signed)
Addended by: Janelle FloorHOMPSON, Katrin Grabel B on: 09/13/2015 05:13 PM   Modules accepted: Orders

## 2015-09-13 NOTE — Addendum Note (Signed)
Addended by: Baldwin CrownJOHNSON, Gerene Nedd D on: 09/13/2015 05:14 PM   Modules accepted: Orders

## 2015-09-14 LAB — QUANTIFERON TB GOLD ASSAY (BLOOD)
Interferon Gamma Release Assay: POSITIVE — AB
QUANTIFERON NIL VALUE: 0.04 [IU]/mL
QUANTIFERON TB AG MINUS NIL: 3.85 [IU]/mL

## 2015-09-14 LAB — HEPATITIS B SURFACE ANTIGEN: Hepatitis B Surface Ag: NEGATIVE

## 2015-09-14 LAB — HEPATITIS B SURFACE ANTIBODY,QUALITATIVE: Hep B S Ab: POSITIVE — AB

## 2015-09-16 LAB — MEASLES/MUMPS/RUBELLA IMMUNITY
Mumps IgG: >300 AU/mL — ABNORMAL HIGH (ref <9.00)
RUBELLA: 3.84 {index} — AB (ref <0.90)
Rubeola IgG: >300 AU/mL — ABNORMAL HIGH (ref <25.00)

## 2015-09-18 ENCOUNTER — Telehealth: Payer: Self-pay | Admitting: Adult Health

## 2015-09-18 ENCOUNTER — Ambulatory Visit (INDEPENDENT_AMBULATORY_CARE_PROVIDER_SITE_OTHER)
Admission: RE | Admit: 2015-09-18 | Discharge: 2015-09-18 | Disposition: A | Discharge status: Home / Self Care | Payer: BLUE CROSS/BLUE SHIELD | Source: Ambulatory Visit | Attending: Adult Health | Admitting: Adult Health

## 2015-09-18 DIAGNOSIS — R7612 Nonspecific reaction to cell mediated immunity measurement of gamma interferon antigen response without active tuberculosis: Secondary | ICD-10-CM

## 2015-09-18 NOTE — Telephone Encounter (Signed)
Spoke to [place] and informed her of her lab results. She is unsure ( but thinks she did have)  if she had a BCG vaccination in [place]. [place].   Will get chest x ray to r/o TB

## 2015-09-18 NOTE — Telephone Encounter (Signed)
Pt returned your call concerning lab results. Please call back.

## 2015-09-18 NOTE — Progress Notes (Signed)
   Subjective:    Patient ID: Sherri Crosby, female    DOB: 06/29/1975, 40 y.o.   MRN: [id number]  HPI  Saw patient for follow up after TB skin test. Her test was read with 10 mm of induration.   Denies any signs or symptoms of TB  Unknown if she had a BCG vaccination done    Review of Systems  Constitutional: Negative.   HENT: Negative.   Respiratory: Negative.   Cardiovascular: Negative.   Gastrointestinal: Negative.   Neurological: Negative.   All other systems reviewed and are negative.  Past Medical History  Diagnosis Date  . Asthma   . Chicken pox     Social History   Social History  . Marital Status: Married    Spouse Name: N/A  . Number of Children: N/A  . Years of Education: N/A   Occupational History  . Not on file.   Social History Main Topics  . Smoking status: Never Smoker   . Smokeless tobacco: Never Used  . Alcohol Use: No  . Drug Use: No  . Sexual Activity: Not on file   Other Topics Concern  . 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  Family History  Problem Relation Age of Onset  . Cirrhosis Father   . Diabetes Father   . CAD Mother   . Diabetes Paternal Uncle   . Diabetes Paternal Grandmother     Allergies  Allergen Reactions  . Avocado     No current outpatient prescriptions on file prior to visit.   No current facility-administered medications on file prior to visit.    There were no vitals taken for this visit.       Objective:   Physical Exam  Constitutional: She is oriented to person, place, and time. She appears well-developed and well-nourished.  Pulmonary/Chest: Effort normal and breath sounds normal. No respiratory distress. She has no wheezes. She has no rales. She exhibits no tenderness.  Neurological: She is alert and oriented to person, place, and time.  Skin: Skin is warm and dry. No  rash noted. She is not diaphoretic. No erythema. No pallor.  Psychiatric: She has a normal mood and affect. Her behavior is normal. Judgment and thought content normal.  Nursing note and vitals reviewed.     Assessment & Plan:  1. 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

## 2015-09-18 NOTE — Telephone Encounter (Signed)
Left VM to call back regarding labs  

## 2015-09-19 ENCOUNTER — Telehealth: Payer: Self-pay | Admitting: Adult Health

## 2015-09-19 ENCOUNTER — Encounter: Payer: Self-pay | Admitting: Adult Health

## 2015-09-19 DIAGNOSIS — R9389 Abnormal findings on diagnostic imaging of other specified body structures: Secondary | ICD-10-CM

## 2015-09-19 NOTE — Telephone Encounter (Signed)
Pt would like xray results. 

## 2015-09-19 NOTE — Telephone Encounter (Signed)
Please advise 

## 2015-09-19 NOTE — Telephone Encounter (Signed)
Spoke to Sherri Crosby informed her of her chest x ray. There are no signs of TB. She does have small graunulomas noted on x ray. I will do a repeat chest x ray in three months.   Although I do not have proof via records of her vaccinations from [place], I am confident that she had the BCG vaccination

## 2015-10-17 ENCOUNTER — Ambulatory Visit: Payer: BLUE CROSS/BLUE SHIELD

## 2016-04-16 ENCOUNTER — Encounter: Payer: Self-pay | Admitting: Adult Health

## 2016-04-16 ENCOUNTER — Ambulatory Visit (INDEPENDENT_AMBULATORY_CARE_PROVIDER_SITE_OTHER): Payer: BLUE CROSS/BLUE SHIELD | Admitting: Adult Health

## 2016-04-16 VITALS — BP 134/60 | Temp 98.1°F | Ht 62.0 in | Wt 153.6 lb

## 2016-04-16 DIAGNOSIS — M5412 Radiculopathy, cervical region: Secondary | ICD-10-CM

## 2016-04-16 MED ORDER — METHYLPREDNISOLONE 4 MG PO TBPK: ORAL_TABLET | ORAL | 0 refills | Status: DC

## 2016-04-16 MED ORDER — CYCLOBENZAPRINE HCL 10 MG PO TABS: 10.0000 mg | ORAL_TABLET | Freq: Three times a day (TID) | ORAL | 0 refills | Status: DC | PRN

## 2016-04-16 NOTE — Progress Notes (Signed)
Subjective:    Patient ID: Sherri Crosby, female    DOB: 12-04-1975, 41 y.o.   MRN: [id number]  HPI  41 year female who presents to the office today for the complaint of right shoulder pain. Her pain started about a week and a half ago after she got done shoveling her drive way. She reports that she also had pain in her left shoulder but that it has since resolved. She denies any numbness or tingling in her right arm. Has not had any loss of ROM. She does endorse worsening pain with rowing movements. The pain is described as a sharp ache.   No other complaints.   Review of Systems See HPI  Past Medical History:  Diagnosis Date  . Asthma   . Chicken pox   . 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 Marital status: Married    Spouse name: N/A  . Number of children: N/A  . Years of education: N/A   Occupational History  . Not on file.   Social History Main Topics  . Smoking status: Never Smoker  . Smokeless tobacco: Never Used  . Alcohol use No  . Drug use: No  . Sexual activity: Not on file   Other Topics Concern  . Not on file   Social History Narrative   She is a Production designer, theatre/television/film at [employer]  For 12 years    Married    One child       No past surgical history on file.  Family History  Problem Relation Age of Onset  . Cirrhosis Father   . Diabetes Father   . CAD Mother   . Diabetes Paternal Uncle   . Diabetes Paternal Grandmother     Allergies  Allergen Reactions  . Avocado     No current outpatient prescriptions on file prior to visit.   No current facility-administered medications on file prior to visit.     BP 134/60   Temp 98.1 F (36.7 C) (Oral)   Ht 5\' 2"  (1.575 m)   Wt 153 lb 9.6 oz (69.7 kg)   BMI 28.09 kg/m       Objective:   Physical Exam  Constitutional: She is oriented to person, place, and time. She appears well-developed and well-nourished. No distress.  Cardiovascular: Normal rate,  regular rhythm, normal heart sounds and intact distal pulses.  Exam reveals no gallop.   No murmur heard. Pulmonary/Chest: Effort normal and breath sounds normal. No respiratory distress. She has no wheezes. She has no rales. She exhibits no tenderness.  Musculoskeletal: Normal range of motion. She exhibits tenderness (to right trapezius with palpation. No decrease in grip strength ). She exhibits no edema or deformity.  Neurological: She is alert and oriented to person, place, and time.  Skin: Skin is warm and dry. No rash noted. She is not diaphoretic. No erythema. No pallor.  Psychiatric: She has a normal mood and affect. Her behavior is normal. Judgment and thought content normal.  Nursing note and vitals reviewed.     Assessment & Plan:  1. Cervical radiculopathy - So concern for rotator cuff injury.  - cyclobenzaprine (FLEXERIL) 10 MG tablet; Take 1 tablet (10 mg total) by mouth 3 (three) times daily as needed for muscle spasms.  Dispense: 30 tablet; Refill: 0 - methylPREDNISolone (MEDROL DOSEPAK) 4 MG TBPK tablet; Take as directed  Dispense: 21 tablet; Refill: 0 - Motrin  600mg  Q8H PRN  - heating pad - Follow up if no improvement  Shirline Freesory Hila Bolding, NP

## 2016-04-16 NOTE — Patient Instructions (Signed)
It was great seeing you today!  I have sent in a prescription for Flexeril (muscle relaxer) and prednisone ( steroid). Take each as directed.   The muscle relaxer can make you sleepy.   Take Motrin as needed  Follow up if no improvement

## 2016-06-17 DIAGNOSIS — H1045 Other chronic allergic conjunctivitis: Secondary | ICD-10-CM | POA: Diagnosis not present

## 2016-06-17 DIAGNOSIS — R21 Rash and other nonspecific skin eruption: Secondary | ICD-10-CM | POA: Diagnosis not present

## 2016-06-17 DIAGNOSIS — R05 Cough: Secondary | ICD-10-CM | POA: Diagnosis not present

## 2016-06-17 DIAGNOSIS — J309 Allergic rhinitis, unspecified: Secondary | ICD-10-CM | POA: Diagnosis not present

## 2016-12-02 ENCOUNTER — Telehealth: Payer: Self-pay | Admitting: Adult Health

## 2016-12-02 ENCOUNTER — Encounter: Payer: Self-pay | Admitting: Adult Health

## 2016-12-02 ENCOUNTER — Ambulatory Visit (INDEPENDENT_AMBULATORY_CARE_PROVIDER_SITE_OTHER): Payer: BLUE CROSS/BLUE SHIELD | Admitting: Adult Health

## 2016-12-02 VITALS — BP 134/86 | Temp 98.1°F | Wt 146.0 lb

## 2016-12-02 DIAGNOSIS — L989 Disorder of the skin and subcutaneous tissue, unspecified: Secondary | ICD-10-CM | POA: Diagnosis not present

## 2016-12-02 MED ORDER — METHYLPREDNISOLONE ACETATE 40 MG/ML IJ SUSP
40.0000 mg | Freq: Once | INTRAMUSCULAR | Status: AC
  Administered 2016-12-02: 40 mg via INTRAMUSCULAR

## 2016-12-02 MED ORDER — METHYLPREDNISOLONE ACETATE 80 MG/ML IJ SUSP
80.0000 mg | Freq: Once | INTRAMUSCULAR | Status: AC
  Administered 2016-12-02: 80 mg via INTRAMUSCULAR

## 2016-12-02 NOTE — Telephone Encounter (Signed)
Patient wanted to talk to [facility] about allergy shots.  She is requesting a call back.

## 2016-12-02 NOTE — Progress Notes (Signed)
Subjective:    Patient ID: Sherri Crosby, female    DOB: [birth year], 41 y.o.   MRN: [id number]  HPI  41 year old female who  has a past medical history of Asthma; Chicken pox; and Positive PPD. She presents today for a walk in visit. She reports breaking out in a rash on hands over the past few days. She reports that her hands have been very itchy and she has a hard time not scratching. Per patient she has had this rash develop sporadically since 2004. She has been seen by [facility] in the past but could not get an appointment with her Allergist today, so she decided to come here.   She reports that she has received steroid shots at the allergy office in the past and responded well to them. Documents show that she has received Depo Medrol 120 mg, last being in 09/11/2015.   Review of Systems See HPI   Past Medical History:  Diagnosis Date  . Asthma   . Chicken pox   . 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 Marital status: Married    Spouse name: N/A  . Number of children: N/A  . Years of education: N/A   Occupational History  . Not on file.   Social History Main Topics  . Smoking status: Never Smoker  . Smokeless tobacco: Never Used  . Alcohol use No  . Drug use: No  . Sexual activity: Not on file   Other Topics Concern  . Not on file   Social History Narrative   She is a Production designer, theatre/television/film at [employer]  For 12 years    Married    One child       No past surgical history on file.  Family History  Problem Relation Age of Onset  . Cirrhosis Father   . Diabetes Father   . CAD Mother   . Diabetes Paternal Uncle   . Diabetes Paternal Grandmother     Allergies  Allergen Reactions  . Avocado     Current Outpatient Prescriptions on File Prior to Visit  Medication Sig Dispense Refill  . methylPREDNISolone (MEDROL DOSEPAK) 4 MG TBPK tablet Take as directed (Patient not taking: Reported on 12/02/2016) 21  tablet 0   No current facility-administered medications on file prior to visit.     BP 134/86   Temp 98.1 F (36.7 C) (Oral)   Wt 146 lb (66.2 kg)   BMI 26.70 kg/m       Objective:   Physical Exam  Constitutional: She is oriented to person, place, and time. She appears well-developed and well-nourished. No distress.  Cardiovascular: Normal rate, regular rhythm, normal heart sounds and intact distal pulses.  Exam reveals no gallop and no friction rub.   No murmur heard. Pulmonary/Chest: Effort normal and breath sounds normal. No respiratory distress. She has no wheezes. She has no rales. She exhibits no tenderness.  Neurological: She is alert and oriented to person, place, and time.  Skin: Skin is warm and dry. No rash noted. No erythema. No pallor.  Patches of scaly, Lichenified eruptions on bilateral hands.No drainage or discharge. Scratch marks noted   Psychiatric: She has a normal mood and affect. Her behavior is normal. Judgment and thought content normal.  Nursing note and vitals reviewed.     Assessment & Plan:  1. Skin eruption resembling psoriasis - methylPREDNISolone acetate (DEPO-MEDROL)  injection 80 mg; Inject 1 mL (80 mg total) into the muscle once. - methylPREDNISolone acetate (DEPO-MEDROL) injection 40 mg; Inject 1 mL (40 mg total) into the muscle once. - Consider referral to dermatology if no improvement   [name], NP

## 2016-12-02 NOTE — Telephone Encounter (Signed)
Appt scheduled with Kandee Keen for today @ 3 PM

## 2016-12-11 ENCOUNTER — Encounter: Payer: Self-pay | Admitting: Family Medicine

## 2016-12-11 ENCOUNTER — Encounter: Payer: Self-pay | Admitting: *Deleted

## 2016-12-11 ENCOUNTER — Ambulatory Visit (INDEPENDENT_AMBULATORY_CARE_PROVIDER_SITE_OTHER): Payer: BLUE CROSS/BLUE SHIELD | Admitting: Family Medicine

## 2016-12-11 VITALS — BP 138/80 | HR 73 | Temp 98.2°F | Ht 62.0 in | Wt 146.8 lb

## 2016-12-11 DIAGNOSIS — J069 Acute upper respiratory infection, unspecified: Secondary | ICD-10-CM | POA: Diagnosis not present

## 2016-12-11 LAB — POCT RAPID STREP A (OFFICE): Rapid Strep A Screen: NEGATIVE

## 2016-12-11 MED ORDER — BENZONATATE 100 MG PO CAPS: 100.0000 mg | ORAL_CAPSULE | Freq: Two times a day (BID) | ORAL | 0 refills | Status: DC | PRN

## 2016-12-11 NOTE — Addendum Note (Signed)
Addended by: Johnella Moloney on: 12/11/2016 03:41 PM   Modules accepted: Orders

## 2016-12-11 NOTE — Patient Instructions (Signed)
BEFORE YOU LEAVE: -rapid strep test   INSTRUCTIONS FOR UPPER RESPIRATORY INFECTION:  -plenty of rest and fluids  -nasal saline wash 2-3 times daily (use prepackaged nasal saline or bottled/distilled water if making your own)   -can use AFRIN nasal spray for drainage and nasal congestion - but do NOT use longer then 3-4 days  -can use tylenol (in no history of liver disease) or ibuprofen (if no history of kidney disease, bowel bleeding or significant heart disease) as directed for aches and sorethroat  -in the winter time, using a humidifier at night is helpful (please follow cleaning instructions)  -if you are taking a cough medication - use only as directed, may also try a teaspoon of honey to coat the throat and throat lozenges. If given a cough medication with codeine or hydrocodone or other narcotic please be advised that this contains a strong and  potentially addicting medication. Please follow instructions carefully, take as little as possible and only use AS NEEDED for severe cough. Discuss potential side effects with your pharmacy. Please do not drive or operate machinery while taking these types of medications. Please do not take other sedating medications, drugs or alcohol while taking this medication without discussing with your doctor.  -for sore throat, salt water gargles can help  -follow up if you have fevers, facial pain, tooth pain, difficulty breathing or are worsening or symptoms persist longer then expected  Upper Respiratory Infection, Adult An upper respiratory infection (URI) is also known as the common cold. It is often caused by a type of germ (virus). Colds are easily spread (contagious). You can pass it to others by kissing, coughing, sneezing, or drinking out of the same glass. Usually, you get better in 1 to 3  weeks.  However, the cough can last for even longer. HOME CARE   Only take medicine as told by your doctor. Follow instructions provided above.  Drink  enough water and fluids to keep your pee (urine) clear or pale yellow.  Get plenty of rest.  Return to work when your temperature is < 100 for 24 hours or as told by your doctor. You may use a face mask and wash your hands to stop your cold from spreading. GET HELP RIGHT AWAY IF:   After the first few days, you feel you are getting worse.  You have questions about your medicine.  You have chills, shortness of breath, or red spit (mucus).  You have pain in the face for more then 1-2 days, especially when you bend forward.  You have a fever, puffy (swollen) neck, pain when you swallow, or white spots in the back of your throat.  You have a bad headache, ear pain, sinus pain, or chest pain.  You have a high-pitched whistling sound when you breathe in and out (wheezing).  You cough up blood.  You have sore muscles or a stiff neck. MAKE SURE YOU:   Understand these instructions.  Will watch your condition.  Will get help right away if you are not doing well or get worse. Document Released: 08/19/2007 Document Revised: 05/25/2011 Document Reviewed: 06/07/2013 [facility] Patient Information 2015 [place], [place]. This information is not intended to replace advice given to you by your health care provider. Make sure you discuss any questions you have with your health care provider.

## 2016-12-11 NOTE — Progress Notes (Signed)
HPI:  Acute visit for URI: -started:2 days ago -symptoms:nasal congestion, sore throat, cough, postnasal drip, mild sinus headache -denies:fever, SOB, NVD, tooth pain, body aches -has tried: nothing -sick contacts/travel/risks: no reported flu, strep or tick exposure -Hx of: allergies  Eczema: -Reports seeing her primary care provider and an allergist for this -Had steroid shot recently and is improving -Reports chronically dry skin throughout and wonders about good creams for this  ROS: See pertinent positives and negatives per HPI.  Past Medical History:  Diagnosis Date  . Asthma   . Chicken pox   . 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  Family History  Problem Relation Age of Onset  . Cirrhosis Father   . Diabetes Father   . CAD Mother   . Diabetes Paternal Uncle   . Diabetes Paternal Grandmother     Social History   Social History  . Marital status: Married    Spouse name: N/A  . Number of children: N/A  . Years of education: N/A   Social History Main Topics  . Smoking status: Never Smoker  . Smokeless tobacco: Never Used  . Alcohol use No  . Drug use: No  . Sexual activity: Not Asked   Other Topics Concern  . None   Social History Narrative   She is a Production designer, theatre/television/film at [employer]  For 12 years    Married    One child        Current Outpatient Prescriptions:  .  cetirizine (ZYRTEC) 10 MG tablet, Take 10 mg by mouth daily., Disp: , Rfl:  .  benzonatate (TESSALON) 100 MG capsule, Take 1 capsule (100 mg total) by mouth 2 (two) times daily as needed for cough., Disp: 20 capsule, Rfl: 0  EXAM:  Vitals:   12/11/16 1457  BP: 138/80  Pulse: 73  Temp: 98.2 F (36.8 C)    Body mass index is 26.85 kg/m.  GENERAL: vitals reviewed and listed above, alert, oriented, appears well hydrated and in no acute distress  HEENT: atraumatic, conjunttiva clear, no obvious abnormalities on inspection of external  nose and ears, normal appearance of ear canals and TMs, clear nasal congestion, mild post oropharyngeal erythema with PND, no tonsillar edema or exudate, no sinus TTP  NECK: no obvious masses on inspection  LUNGS: clear to auscultation bilaterally, no wheezes, rales or rhonchi, good air movement  CV: HRRR, no peripheral edema  MS: moves all extremities without noticeable abnormality  SKIN: dry skin  PSYCH: pleasant and cooperative, no obvious depression or anxiety  ASSESSMENT AND PLAN:  Discussed the following assessment and plan:  Viral upper respiratory tract infection  -given HPI and exam findings today, a serious infection or illness is unlikely. We discussed potential etiologies, with VURI being most likely, and advised supportive care and monitoring. We discussed treatment side effects, likely course, antibiotic misuse, transmission, and signs of developing a serious illness.Tessalon sent for cough. 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 This was negative. -of course, we advised to return or notify a doctor immediately if symptoms worsen or persist or new concerns arise.  She is seen other providers for her eczema, but did advise several good emollient creams for patients with eczema including Cerave cream and Aquaphor for the hands.    Patient Instructions  BEFORE YOU LEAVE: -rapid strep test   INSTRUCTIONS FOR UPPER RESPIRATORY INFECTION:  -plenty of rest  and fluids  -nasal saline wash 2-3 times daily (use prepackaged nasal saline or bottled/distilled water if making your own)   -can use AFRIN nasal spray for drainage and nasal congestion - but do NOT use longer then 3-4 days  -can use tylenol (in no history of liver disease) or ibuprofen (if no history of kidney disease, bowel bleeding or significant heart disease) as directed for aches and sorethroat  -in the winter time, using a humidifier at night is helpful (please follow cleaning  instructions)  -if you are taking a cough medication - use only as directed, may also try a teaspoon of honey to coat the throat and throat lozenges. If given a cough medication with codeine or hydrocodone or other narcotic please be advised that this contains a strong and  potentially addicting medication. Please follow instructions carefully, take as little as possible and only use AS NEEDED for severe cough. Discuss potential side effects with your pharmacy. Please do not drive or operate machinery while taking these types of medications. Please do not take other sedating medications, drugs or alcohol while taking this medication without discussing with your doctor.  -for sore throat, salt water gargles can help  -follow up if you have fevers, facial pain, tooth pain, difficulty breathing or are worsening or symptoms persist longer then expected  Upper Respiratory Infection, Adult An upper respiratory infection (URI) is also known as the common cold. It is often caused by a type of germ (virus). Colds are easily spread (contagious). You can pass it to others by kissing, coughing, sneezing, or drinking out of the same glass. Usually, you get better in 1 to 3  weeks.  However, the cough can last for even longer. HOME CARE   Only take medicine as told by your doctor. Follow instructions provided above.  Drink enough water and fluids to keep your pee (urine) clear or pale yellow.  Get plenty of rest.  Return to work when your temperature is < 100 for 24 hours or as told by your doctor. You may use a face mask and wash your hands to stop your cold from spreading. GET HELP RIGHT AWAY IF:   After the first few days, you feel you are getting worse.  You have questions about your medicine.  You have chills, shortness of breath, or red spit (mucus).  You have pain in the face for more then 1-2 days, especially when you bend forward.  You have a fever, puffy (swollen) neck, pain when you  swallow, or white spots in the back of your throat.  You have a bad headache, ear pain, sinus pain, or chest pain.  You have a high-pitched whistling sound when you breathe in and out (wheezing).  You cough up blood.  You have sore muscles or a stiff neck. MAKE SURE YOU:   Understand these instructions.  Will watch your condition.  Will get help right away if you are not doing well or get worse. Document Released: 08/19/2007 Document Revised: 05/25/2011 Document Reviewed: 06/07/2013 [facility] Patient Information 2015 [place], [place]. This information is not intended to replace advice given to you by your health care provider. Make sure you discuss any questions you have with your health care provider.     Kriste Basque R., DO

## 2016-12-18 ENCOUNTER — Telehealth: Payer: Self-pay | Admitting: *Deleted

## 2016-12-18 NOTE — Telephone Encounter (Signed)
I called patient to make her aware no answer I left a message.

## 2016-12-18 NOTE — Telephone Encounter (Signed)
Ok with Kandee Keen for pt to see Dr. Selena Batten if she is willing to see pt.

## 2016-12-18 NOTE — Telephone Encounter (Signed)
Patient called stating she has a rash and she seen Dr Selena Batten last time for it, I made patient aware Kandee Keen has something available at 3 or 3:30 this afternoon, patient states she does not want to see Kandee Keen for this, patient requested Dr Selena Batten since she is a female and she can show her. I made patient aware that Kandee Keen is her PCP and if he has availability we will have to schedule with him. Patient requested Dr Selena Batten only for this. Please advise

## 2016-12-18 NOTE — Telephone Encounter (Signed)
We usually recommend follow-up with the primary provider. In this case, Sherri Crosby was actually treating her for this rash, so would be the best to follow up with him.  I saw her for respiratory infection. Usually I will recommend that the patient see dermatology if they have an ongoing rash that is not getting better. She can call to set this up on her own. If she still wishes to see me after this explanation, I am happy to see her, but this is an exception.

## 2016-12-18 NOTE — Telephone Encounter (Signed)
I called the pt and informed her of the message below and I gave her the phone number to call [facility] for an appt as there are female providers within this group.

## 2016-12-18 NOTE — Telephone Encounter (Signed)
Can Dr Selena Batten see this patient?

## 2017-06-30 DIAGNOSIS — H1045 Other chronic allergic conjunctivitis: Secondary | ICD-10-CM | POA: Diagnosis not present

## 2017-06-30 DIAGNOSIS — T781XXA Other adverse food reactions, not elsewhere classified, initial encounter: Secondary | ICD-10-CM | POA: Diagnosis not present

## 2017-06-30 DIAGNOSIS — J309 Allergic rhinitis, unspecified: Secondary | ICD-10-CM | POA: Diagnosis not present

## 2017-06-30 DIAGNOSIS — R05 Cough: Secondary | ICD-10-CM | POA: Diagnosis not present

## 2017-07-13 ENCOUNTER — Encounter: Payer: Self-pay | Admitting: Family Medicine

## 2017-07-26 IMAGING — DX DG CHEST 2V
2 series · 2 of 2 positions shown · non-contrast
Comparison: None.

CLINICAL DATA: 40-year-old female positive PPD. Asymptomatic.
Initial encounter.

EXAM:
CHEST  2 VIEW

[chest pa]
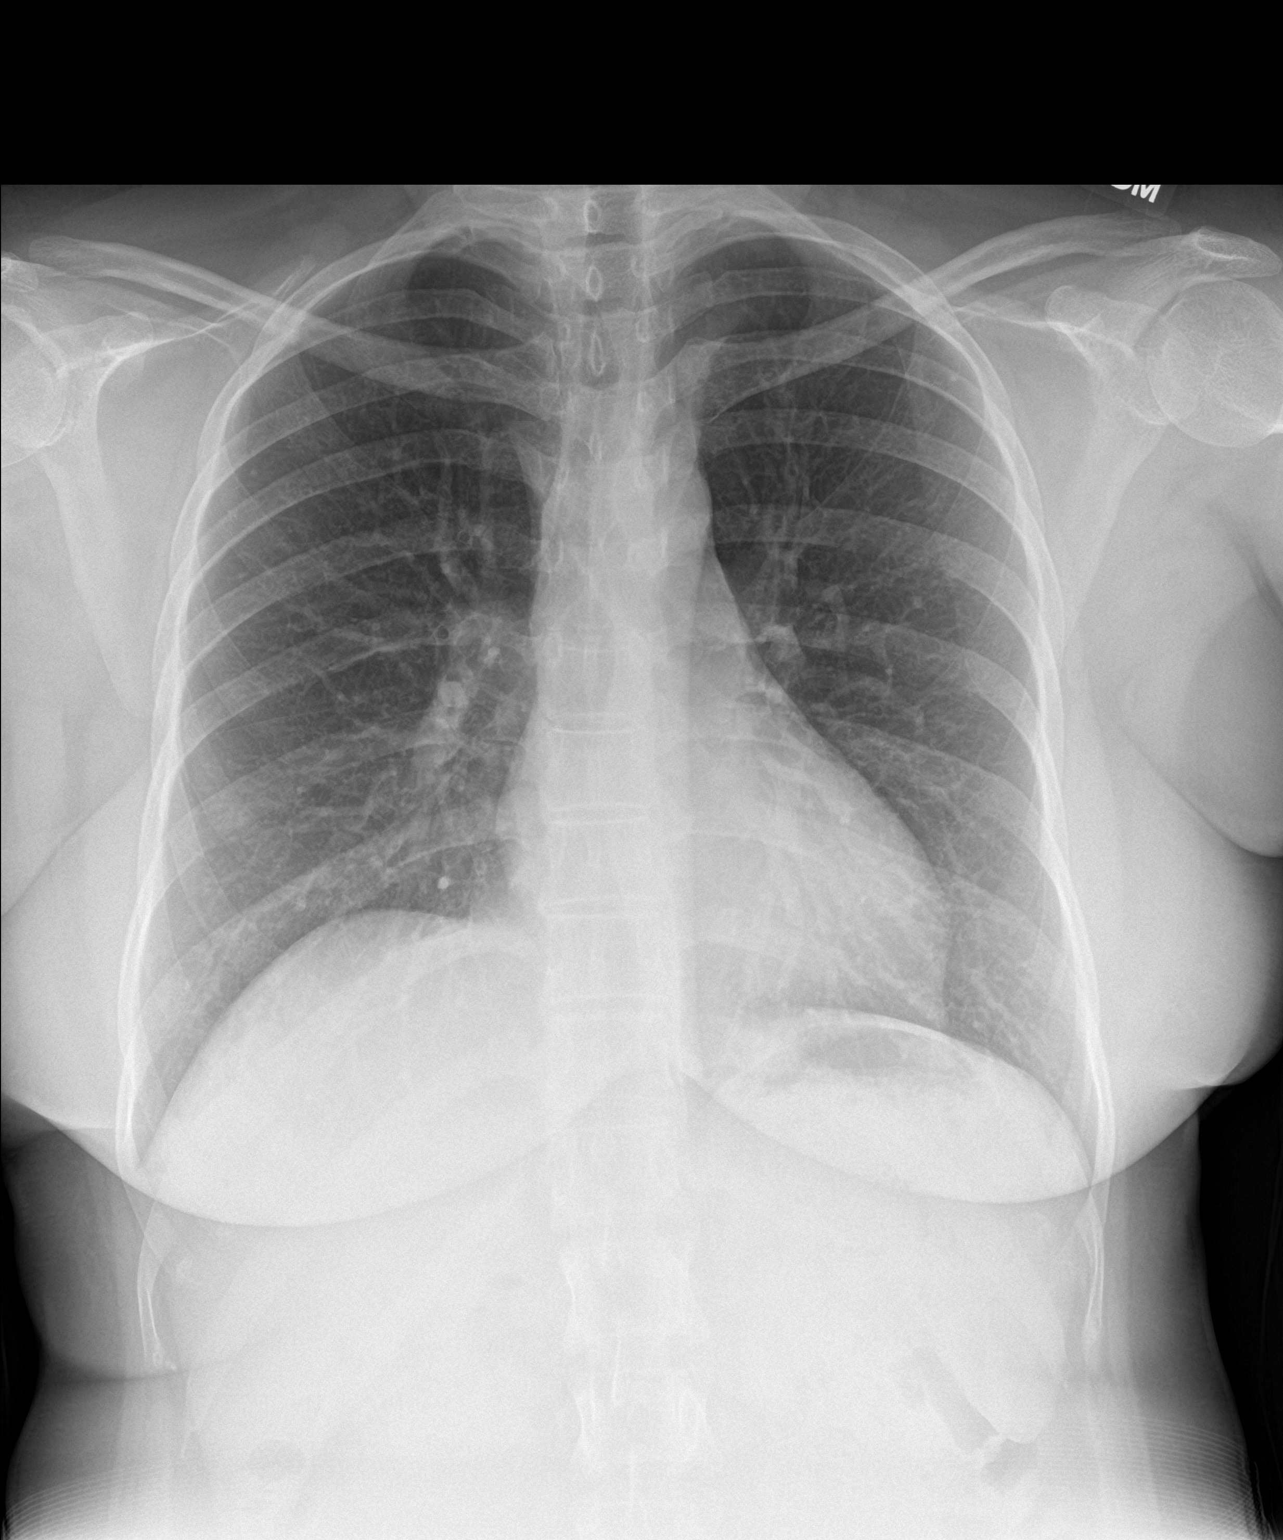

[chest lat]
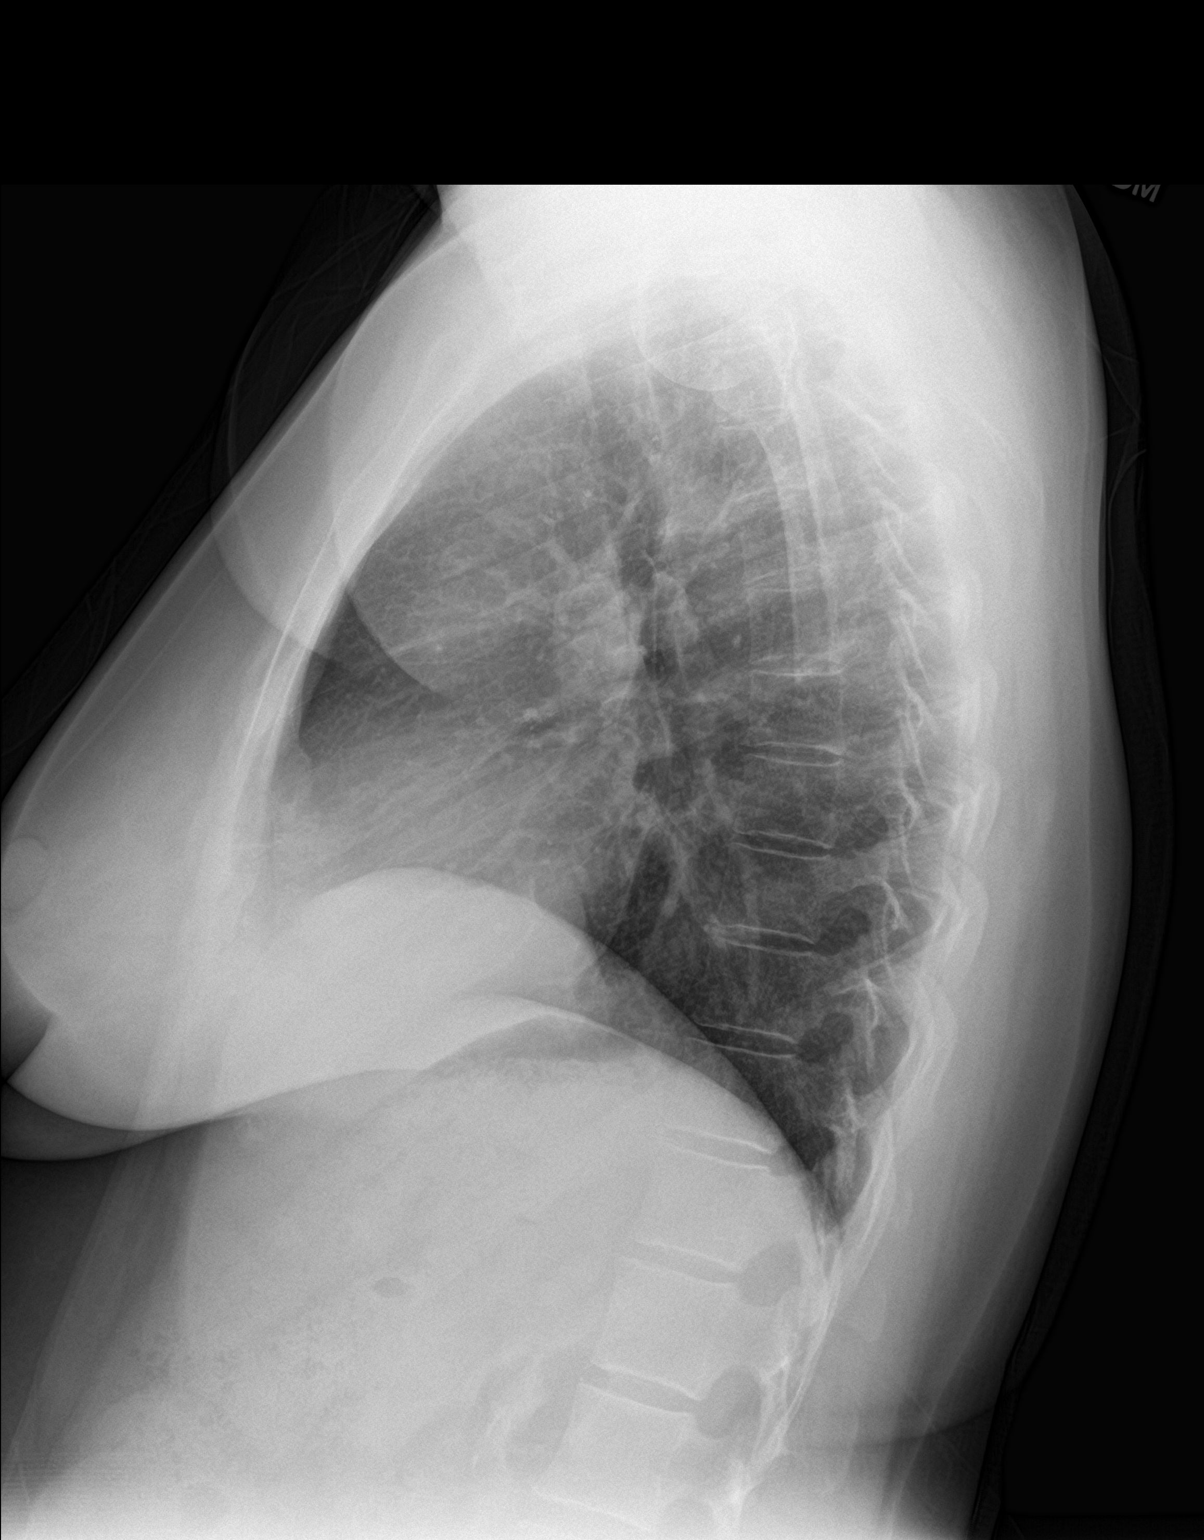

[2 of 2 positions shown; findings below may reference images not displayed]

FINDINGS: Normal lung volumes. Normal cardiac size and mediastinal contours.
Visualized tracheal air column is within normal limits. A tiny
calcified granuloma is suggested in each lung apex. Otherwise the
lungs are clear. No pneumothorax or pleural effusion. Very mild
levoconvex upper thoracic scoliosis. No other osseous abnormality
identified.
IMPRESSION: Negative, no acute cardiopulmonary abnormality.

## 2017-08-02 ENCOUNTER — Ambulatory Visit (INDEPENDENT_AMBULATORY_CARE_PROVIDER_SITE_OTHER): Payer: BC Managed Care – PPO | Admitting: Family Medicine

## 2017-08-02 ENCOUNTER — Encounter: Payer: Self-pay | Admitting: Family Medicine

## 2017-08-02 VITALS — BP 134/72 | HR 94 | Temp 98.2°F | Ht 62.0 in | Wt 151.0 lb

## 2017-08-02 DIAGNOSIS — B9789 Other viral agents as the cause of diseases classified elsewhere: Secondary | ICD-10-CM

## 2017-08-02 DIAGNOSIS — J029 Acute pharyngitis, unspecified: Secondary | ICD-10-CM

## 2017-08-02 DIAGNOSIS — J069 Acute upper respiratory infection, unspecified: Secondary | ICD-10-CM

## 2017-08-02 LAB — POCT RAPID STREP A (OFFICE): RAPID STREP A SCREEN: NEGATIVE

## 2017-08-02 NOTE — Patient Instructions (Addendum)
INSTRUCTIONS FOR UPPER RESPIRATORY INFECTION:   -plenty of rest and fluids   -nasal saline wash 2-3 times daily (use prepackaged nasal saline or bottled/distilled water if making your own)    -can use AFRIN nasal spray for drainage and nasal congestion - but do NOT use longer then 3-4 days   -can use tylenol (in no history of liver disease) or ibuprofen (if no history of kidney disease, bowel bleeding or significant heart disease) as directed for aches and sorethroat   -in the winter time, using a humidifier at night is helpful (please follow cleaning instructions)   -if you are taking a cough medication - use only as directed, may also try a teaspoon of honey to coat the throat and throat lozenges. If given a cough medication with codeine or hydrocodone or other narcotic please be advised that this contains a strong and  potentially addicting medication. Please follow instructions carefully, take as little as possible and only use AS NEEDED for severe cough. Discuss potential side effects with your pharmacy. Please do not drive or operate machinery while taking these types of medications. Please do not take other sedating medications, drugs or alcohol while taking this medication without discussing with your doctor.   -for sore throat, salt water gargles can help   -follow up if you have fevers, facial pain, tooth pain, difficulty breathing or are worsening or symptoms persist longer then expected    Upper Respiratory Infection, Adult Most upper respiratory infections (URIs) are a viral infection of the air passages leading to the lungs. A URI affects the nose, throat, and upper air passages. The most common type of URI is nasopharyngitis and is typically referred to as "the common cold." URIs run their course and usually go away on their own. Most of the time, a URI does not require medical attention, but sometimes a bacterial infection in the upper airways can follow a viral infection. This  is called a secondary infection. Sinus and middle ear infections are common types of secondary upper respiratory infections. Bacterial pneumonia can also complicate a URI. A URI can worsen asthma and chronic obstructive pulmonary disease (COPD). Sometimes, these complications can require emergency medical care and may be life threatening. What are the causes? Almost all URIs are caused by viruses. A virus is a type of germ and can spread from one person to another. What increases the risk? You may be at risk for a URI if:  You smoke.  You have chronic heart or lung disease.  You have a weakened defense (immune) system.  You are very young or very old.  You have nasal allergies or asthma.  You work in crowded or poorly ventilated areas.  You work in health care facilities or schools.  What are the signs or symptoms? Symptoms typically develop 2-3 days after you come in contact with a cold virus. Most viral URIs last 7-10 days. However, viral URIs from the influenza virus (flu virus) can last 14-18 days and are typically more severe. Symptoms may include:  Runny or stuffy (congested) nose.  Sneezing.  Cough.  Sore throat.  Headache.  Fatigue.  Fever.  Loss of appetite.  Pain in your forehead, behind your eyes, and over your cheekbones (sinus pain).  Muscle aches.  How is this diagnosed? Your health care provider may diagnose a URI by:  Physical exam.  Tests to check that your symptoms are not due to another condition such as: ? Strep throat. ? Sinusitis. ? Pneumonia. ?  Asthma.  How is this treated? A URI goes away on its own with time. It cannot be cured with medicines, but medicines may be prescribed or recommended to relieve symptoms. Medicines may help:  Reduce your fever.  Reduce your cough.  Relieve nasal congestion.  Follow these instructions at home:  Take medicines only as directed by your health care provider.  Gargle warm saltwater or take  cough drops to comfort your throat as directed by your health care provider.  Use a warm mist humidifier or inhale steam from a shower to increase air moisture. This may make it easier to breathe.  Drink enough fluid to keep your urine clear or pale yellow.  Eat soups and other clear broths and maintain good nutrition.  Rest as needed.  Return to work when your temperature has returned to normal or as your health care provider advises. You may need to stay home longer to avoid infecting others. You can also use a face mask and careful hand washing to prevent spread of the virus.  Increase the usage of your inhaler if you have asthma.  Do not use any tobacco products, including cigarettes, chewing tobacco, or electronic cigarettes. If you need help quitting, ask your health care provider. How is this prevented? The best way to protect yourself from getting a cold is to practice good hygiene.  Avoid oral or hand contact with people with cold symptoms.  Wash your hands often if contact occurs.  There is no clear evidence that vitamin C, vitamin E, echinacea, or exercise reduces the chance of developing a cold. However, it is always recommended to get plenty of rest, exercise, and practice good nutrition. Contact a health care provider if:  You are getting worse rather than better.  Your symptoms are not controlled by medicine.  You have chills.  You have worsening shortness of breath.  You have brown or red mucus.  You have yellow or brown nasal discharge.  You have pain in your face, especially when you bend forward.  You have a fever.  You have swollen neck glands.  You have pain while swallowing.  You have white areas in the back of your throat. Get help right away if:  You have severe or persistent: ? Headache. ? Ear pain. ? Sinus pain. ? Chest pain.  You have chronic lung disease and any of the following: ? Wheezing. ? Prolonged cough. ? Coughing up  blood. ? A change in your usual mucus.  You have a stiff neck.  You have changes in your: ? Vision. ? Hearing. ? Thinking. ? Mood. This information is not intended to replace advice given to you by your health care provider. Make sure you discuss any questions you have with your health care provider. Document Released: 08/26/2000 Document Revised: 11/03/2015 Document Reviewed: 06/07/2013 [employer] Interactive Patient Education  [employer].

## 2017-08-02 NOTE — Progress Notes (Signed)
Subjective:    Patient ID: Sherri Crosby, female    DOB: 12-24-1975, 42 y.o.   MRN: [id number]  Chief Complaint  Patient presents with  . URI    Sypmtoms ongoing for 3 days-nonproductive cough,runny nose, headaches. Denies fever.She has been taking Aleve.    HPI Patient was seen today for acute concern.  Pt endorses sore throat, chills, possible fever, coughing, nasal congestion since Friday evening.  Pt states she has taken Mucinex and Vicks for her symptoms.  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  Pt denies facial pain/pressure, SOB, myalgias.  Pt has a h/o seasonal allergies. 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  Past Medical History:  Diagnosis Date  . Asthma   . Chicken pox   . 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 Avocado     ROS General: Denies fever, chills, night sweats, changes in weight, changes in appetite  +chills HEENT: Denies headaches, ear pain, changes in vision, rhinorrhea   +nasal congestion, sore throat CV: Denies CP, palpitations, SOB, orthopnea Pulm: Denies SOB, wheezing  +cough GI: Denies abdominal pain, nausea, vomiting, diarrhea, constipation GU: Denies dysuria, hematuria, frequency, vaginal discharge Msk: Denies muscle cramps, joint pains Neuro: Denies weakness, numbness, tingling Skin: Denies rashes, bruising Psych: Denies depression, anxiety, hallucinations     Objective:    Blood pressure 134/72, pulse 94, temperature 98.2 F (36.8 C), temperature source Oral, height  (1.575 m), weight 151 lb (68.5 kg), SpO2 99 %.   Gen. Pleasant, well-nourished, in no distress, normal affect   HEENT: [place]/AT, face symmetric, no scleral icterus, PERRLA, nares patent with mild drainage, pharynx without erythema or exudate.  TMs normal bilaterally.  mild cervical lymphadenopathy. Lungs: no accessory muscle use, CTAB, no  wheezes or rales Cardiovascular: RRR, no m/r/g, no peripheral edema Abdomen: BS present, soft, NT/ND Neuro:  A&Ox3, CN II-XII intact, normal gait   Wt Readings from Last 3 Encounters:  08/02/17 151 lb (68.5 kg)  12/11/16 146 lb 12.8 oz (66.6 kg)  12/02/16 146 lb (66.2 kg)    Lab Results  Component Value Date   WBC 5.9 09/04/2015   HGB 10.1 (L) 09/04/2015   HCT 31.6 (L) 09/04/2015   PLT 218.0 09/04/2015   GLUCOSE 99 09/04/2015   CHOL 204 (H) 09/04/2015   TRIG 78.0 09/04/2015   HDL 60.00 09/04/2015   LDLCALC 129 (H) 09/04/2015   ALT 19 09/04/2015   AST 19 09/04/2015   NA 138 09/04/2015   K 4.2 09/04/2015   CL 105 09/04/2015   CREATININE 0.48 09/04/2015   BUN 13 09/04/2015   CO2 27 09/04/2015   TSH 3.42 09/04/2015    Assessment/Plan:  Viral URI with cough  -supportive care -ok to continue mucinex.  Consider Afrin, but limit use to 3-4 days. - Plan: POCT rapid strep A  Sore throat  -gargle with warm salt water or Chloraseptic spray. - Plan: POCT rapid strep A  Negative  Patient given a note for work. Follow-up PRN  Abbe Amsterdam, MD

## 2018-02-03 ENCOUNTER — Encounter: Payer: Self-pay | Admitting: Family Medicine

## 2018-02-03 ENCOUNTER — Ambulatory Visit: Payer: BC Managed Care – PPO | Admitting: Family Medicine

## 2018-02-03 VITALS — BP 128/70 | HR 93 | Temp 98.3°F | Ht 62.0 in | Wt 144.8 lb

## 2018-02-03 DIAGNOSIS — Z23 Encounter for immunization: Secondary | ICD-10-CM | POA: Diagnosis not present

## 2018-02-03 DIAGNOSIS — J329 Chronic sinusitis, unspecified: Secondary | ICD-10-CM

## 2018-02-03 DIAGNOSIS — L309 Dermatitis, unspecified: Secondary | ICD-10-CM | POA: Diagnosis not present

## 2018-02-03 MED ORDER — TRIAMCINOLONE ACETONIDE 0.5 % EX OINT: 1.0000 "application " | TOPICAL_OINTMENT | Freq: Two times a day (BID) | CUTANEOUS | 0 refills | Status: DC

## 2018-02-03 MED ORDER — AMOXICILLIN 875 MG PO TABS
875.0000 mg | ORAL_TABLET | Freq: Two times a day (BID) | ORAL | 0 refills | Status: DC
Start: 2018-02-03 — End: 2019-07-05

## 2018-02-03 NOTE — Patient Instructions (Signed)
Continue the atrovent.  Start the amoxicillin if your symptoms worsen or do not improve in a few days.  Please stay well hydrated.  You can take tylenol and/or motrin as needed for low grade fever and pain.  Please let me know if your symptoms worsen or fail to improve.  I will also send in a cream for your eczema.  Take care, Dr Jimmey RalphParker

## 2018-02-03 NOTE — Progress Notes (Signed)
   Subjective:  Sherri Crosby is a 42 y.o. female who presents today for same-day appointment with a chief complaint of ear pain.   HPI:  Ear Pain, Acute problem Started 2-3 days ago.  Stable at that time.  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  She has not tried any specific treatments.  No rhinorrhea.  She has had some head pressure.  Pain located in her left ear and radiates towards her child.  She also has some watery eyes and her eyes felt "sticky" this morning.  No sore throat.  She is tried taking Aleve which helps some.  Eczema, chronic problem Patient with long-standing history of eczema on her upper extremities.  She has tried prescription steroid creams in the past which worked well.  She would like a refill today.  ROS: Per HPI  PMH: She reports that she has never smoked. She has never used smokeless tobacco. She reports that she does not drink alcohol or use drugs.  Objective:  Physical Exam: BP 128/70 (BP Location: Left Arm, Patient Position: Sitting, Cuff Size: Normal)   Pulse 93   Temp 98.3 F (36.8 C) (Oral)   Ht 5\' 2"  (1.575 m)   Wt 144 lb 12.8 oz (65.7 kg)   SpO2 98%   BMI 26.48 kg/m   Gen: NAD, resting comfortably HEENT: Right TM clear.  Left TM with clear effusion.  His mucosa erythematous and boggy.  Oropharynx slightly erythematous. CV: RRR with no murmurs appreciated Pulm: NWOB, CTAB with no crackles, wheezes, or rhonchi MSK: No edema, cyanosis, or clubbing noted Skin: Eczematous patches noted on upper extremities bilaterally. Neuro: Grossly normal, moves all extremities Psych: Normal affect and thought content  Assessment/Plan:  Sinusitis No red flags.  No signs of bacterial infection today.  She has Atrovent nasal spray at home that was prescribed by her allergist-advised her to use this 3-4 times daily over the next few days.  Will give a "pocket prescription" for amoxicillin with strict instruction to not  start unless symptoms worsen or do not improve the next few days.  Recommended good oral hydration and relative rest over the next few days.  Also recommended Tylenol/Motrin as needed.  Eczema Triamcinolone ointment sent in.  Katina Degreealeb M. Jimmey RalphParker, MD 02/03/2018 3:39 PM

## 2019-07-04 ENCOUNTER — Other Ambulatory Visit: Payer: Self-pay

## 2019-07-05 ENCOUNTER — Encounter: Payer: Self-pay | Admitting: Adult Health

## 2019-07-05 ENCOUNTER — Ambulatory Visit (INDEPENDENT_AMBULATORY_CARE_PROVIDER_SITE_OTHER): Payer: BLUE CROSS/BLUE SHIELD | Admitting: Adult Health

## 2019-07-05 VITALS — BP 110/68 | HR 85 | Temp 97.6°F | Wt 154.8 lb

## 2019-07-05 DIAGNOSIS — H6982 Other specified disorders of Eustachian tube, left ear: Secondary | ICD-10-CM

## 2019-07-05 NOTE — Progress Notes (Signed)
Subjective:    Patient ID: Sherri Crosby, female    DOB: 11-Apr-1975, 44 y.o.   MRN: [id number]  HPI 44 year old female who  has a past medical history of Asthma, Chicken pox, and Positive PPD.  She presents to the office today for an acute issue of left-sided ear pain.  Ear pain started approximately 24 hours ago.  Her pain is intermittent and is described as a "sharp pain".  She denies headaches, blurred vision, fevers, chills, or drainage from the ear.  She has not used anything OTC  She does have seasonal allergies    Review of Systems See HPI   Past Medical History:  Diagnosis Date  . Asthma   . Chicken pox   . 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 Marital status: Married    Spouse name: Not on file  . Number of children: Not on file  . Years of education: Not on file  . Highest education level: Not on file  Occupational History  . Not on file  Tobacco Use  . Smoking status: Never Smoker  . Smokeless tobacco: Never Used  Substance and Sexual Activity  . Alcohol use: No  . Drug use: No  . Sexual activity: Not on file  Other Topics Concern  . Not on file  Social History Narrative   She is a Freight forwarder at [employer]  For 12 years    Married    One child   Social Determinants of Radio broadcast assistant Strain:   . Difficulty of Paying Living Expenses:   Food Insecurity:   . Worried About Charity fundraiser in the Last Year:   . Arboriculturist in the Last Year:   Transportation Needs:   . Film/video editor (Medical):   Marland Kitchen Lack of Transportation (Non-Medical):   Physical Activity:   . Days of Exercise per Week:   . Minutes of Exercise per Session:   Stress:   . Feeling of Stress :   Social Connections:   . Frequency of Communication with Friends and Family:   . Frequency of Social Gatherings with Friends and Family:   . Attends Religious Services:   . Active Member of Clubs or  Organizations:   . Attends Archivist Meetings:   Marland Kitchen Marital Status:   Intimate Partner Violence:   . Fear of Current or Ex-Partner:   . Emotionally Abused:   Marland Kitchen Physically Abused:   . Sexually Abused:     No past surgical history on file.  Family History  Problem Relation Age of Onset  . Cirrhosis Father   . Diabetes Father   . CAD Mother   . Diabetes Paternal Uncle   . Diabetes Paternal Grandmother     Allergies  Allergen Reactions  . Avocado     Current Outpatient Medications on File Prior to Visit  Medication Sig Dispense Refill  . cetirizine (ZYRTEC) 10 MG tablet Take 10 mg by mouth daily.    Marland Kitchen triamcinolone ointment (KENALOG) 0.5 % Apply 1 application topically 2 (two) times daily. 30 g 0   No current facility-administered medications on file prior to visit.    BP 110/68 (BP Location: Left Arm, Patient Position: Sitting, Cuff Size: Normal)   Pulse 85   Temp 97.6 F (36.4 C) (Temporal)   Wt 154 lb 12.8 oz (70.2 kg)   SpO2 98%  BMI 28.31 kg/m       Objective:   Physical Exam Vitals and nursing note reviewed.  Constitutional:      Appearance: Normal appearance.  HENT:     Right Ear: Hearing, tympanic membrane, ear canal and external ear normal.     Left Ear: Hearing, ear canal and external ear normal. A middle ear effusion is present. There is no impacted cerumen. No mastoid tenderness. Tympanic membrane is bulging. Tympanic membrane is not injected or erythematous.  Neurological:     General: No focal deficit present.     Mental Status: She is alert and oriented to person, place, and time.  Psychiatric:        Mood and Affect: Mood normal.        Behavior: Behavior normal.        Thought Content: Thought content normal.        Judgment: Judgment normal.        Assessment & Plan:  1. Dysfunction of left eustachian tube -No signs of infection.  Advised Afrin twice daily x3 days.  Follow-up if no improvement or if symptoms worsen  [name], NP

## 2019-12-12 ENCOUNTER — Encounter: Payer: Self-pay | Admitting: Adult Health

## 2019-12-12 ENCOUNTER — Telehealth (INDEPENDENT_AMBULATORY_CARE_PROVIDER_SITE_OTHER): Payer: Managed Care, Other (non HMO) | Admitting: Adult Health

## 2019-12-12 DIAGNOSIS — R05 Cough: Secondary | ICD-10-CM | POA: Diagnosis not present

## 2019-12-12 DIAGNOSIS — R059 Cough, unspecified: Secondary | ICD-10-CM

## 2019-12-12 MED ORDER — PREDNISONE 20 MG PO TABS: 20.0000 mg | ORAL_TABLET | Freq: Every day | ORAL | 0 refills | Status: DC

## 2019-12-12 MED ORDER — HYDROCODONE-HOMATROPINE 5-1.5 MG/5ML PO SYRP: 5.0000 mL | ORAL_SOLUTION | Freq: Three times a day (TID) | ORAL | 0 refills | Status: DC | PRN

## 2019-12-12 NOTE — Progress Notes (Signed)
Virtual Visit via Telephone Note  I connected with Sherri Crosby on 12/12/19 at  4:00 PM EDT by telephone and verified that I am speaking with the correct person using two identifiers.   I discussed the limitations, risks, security and privacy concerns of performing an evaluation and management service by telephone and the availability of in person appointments. I also discussed with the patient that there may be a patient responsible charge related to this service. The patient expressed understanding and agreed to proceed.  Location patient: home Location provider: work or home office Participants present for the call: patient, provider Patient did not have a visit in the prior 7 days to address this/these issue(s).   History of Present Illness: 44 year old female who is being evaluated today for the complaint of a dry cough.  Her cough is consistent throughout the day and night and has been present for 3 to 5 days.  She is having a hard time sleeping at night due to chronic cough.  She denies fevers, chills, nausea, vomiting, diarrhea, loss of taste or smell, sinus pain or pressure.  She does not feel short of breath but may have some mild wheezing.  No one at home is sick and she has been vaccinated against COVID-19   Observations/Objective: Patient sounds cheerful and well on the phone. I do not appreciate any SOB. Speech and thought processing are grossly intact. Patient reported vitals:  Assessment and Plan: 1. Cough - Likely viral. Possible bronchitis. Will treat with prednisone and hycodan cough syrup - predniSONE (DELTASONE) 20 MG tablet; Take 1 tablet (20 mg total) by mouth daily with breakfast.  Dispense: 5 tablet; Refill: 0 - HYDROcodone-homatropine (HYCODAN) 5-1.5 MG/5ML syrup; Take 5 mLs by mouth every 8 (eight) hours as needed for cough.  Dispense: 120 mL; Refill: 0   Follow Up Instructions:  I did not refer this patient for an OV in the next 24 hours for this/these  issue(s).  I discussed the assessment and treatment plan with the patient. The patient was provided an opportunity to ask questions and all were answered. The patient agreed with the plan and demonstrated an understanding of the instructions.   The patient was advised to call back or seek an in-person evaluation if the symptoms worsen or if the condition fails to improve as anticipated.  I provided 13 minutes of non-face-to-face time during this encounter.   [name], NP

## 2020-12-05 ENCOUNTER — Encounter: Payer: Self-pay | Admitting: Internal Medicine

## 2020-12-05 ENCOUNTER — Ambulatory Visit (INDEPENDENT_AMBULATORY_CARE_PROVIDER_SITE_OTHER): Payer: 59 | Admitting: Internal Medicine

## 2020-12-05 ENCOUNTER — Other Ambulatory Visit: Payer: Self-pay

## 2020-12-05 VITALS — BP 110/80 | HR 86 | Temp 98.4°F | Wt 149.2 lb

## 2020-12-05 DIAGNOSIS — N3001 Acute cystitis with hematuria: Secondary | ICD-10-CM | POA: Diagnosis not present

## 2020-12-05 LAB — POCT URINALYSIS DIPSTICK
Bilirubin, UA: NEGATIVE
Glucose, UA: NEGATIVE
Ketones, UA: NEGATIVE
Nitrite, UA: NEGATIVE
Protein, UA: POSITIVE — AB
Spec Grav, UA: 1.015 (ref 1.010–1.025)
Urobilinogen, UA: 0.2 E.U./dL
pH, UA: 8.5 — AB (ref 5.0–8.0)

## 2020-12-05 MED ORDER — SULFAMETHOXAZOLE-TRIMETHOPRIM 800-160 MG PO TABS: 1.0000 | ORAL_TABLET | Freq: Two times a day (BID) | ORAL | 0 refills | Status: AC

## 2020-12-05 NOTE — Progress Notes (Signed)
Acute office Visit     This visit occurred during the SARS-CoV-2 public health emergency.  Safety protocols were in place, including screening questions prior to the visit, additional usage of staff PPE, and extensive cleaning of exam room while observing appropriate contact time as indicated for disinfecting solutions.    CC/Reason for Visit: Dysuria  HPI: Sherri Crosby is a 45 y.o. female who is coming in today for the above mentioned reasons.  3 days ago she started noticing dysuria, urinary frequency with urgency, suprapubic pain towards the end of her urination stream which has progressed to some blood in her urine.  She denies fever, nausea, vomiting.  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  Social History:  reports that she has never smoked. She has never used smokeless tobacco. She reports that she does not drink alcohol and does not use drugs.  Allergies: Allergies  Allergen Reactions   Avocado     Family History:  Family History  Problem Relation Age of Onset   Cirrhosis Father    Diabetes Father    CAD Mother    Diabetes Paternal Uncle    Diabetes Paternal Grandmother      Current Outpatient Medications:    cetirizine (ZYRTEC) 10 MG tablet, Take 10 mg by mouth daily., Disp: , Rfl:    sulfamethoxazole-trimethoprim (BACTRIM DS) 800-160 MG tablet, Take 1 tablet by mouth 2 (two) times daily for 7 days., Disp: 14 tablet, Rfl: 0   triamcinolone ointment (KENALOG) 0.5 %, Apply 1 application topically 2 (two) times daily., Disp: 30 g, Rfl: 0  Review of Systems:  Constitutional: Denies fever, chills, diaphoresis, appetite change and fatigue.  HEENT: Denies photophobia, eye pain, redness, hearing loss, ear pain, congestion, sore throat, rhinorrhea, sneezing, mouth sores, trouble swallowing, neck pain, neck stiffness and  tinnitus.   Respiratory: Denies SOB, DOE, cough, chest tightness,  and wheezing.   Cardiovascular: Denies chest pain, palpitations and leg swelling.  Gastrointestinal: Denies nausea, vomiting, abdominal pain, diarrhea, constipation, blood in stool and abdominal distention.  Genitourinary: Positive for dysuria, urgency, frequency, hematuria.  Endocrine: Denies: hot or cold intolerance, sweats, changes in hair or nails, polyuria, polydipsia. Musculoskeletal: Denies myalgias, back pain, joint swelling, arthralgias and gait problem.  Skin: Denies pallor, rash and wound.  Neurological: Denies dizziness, seizures, syncope, weakness, light-headedness, numbness and headaches.  Hematological: Denies adenopathy. Easy bruising, personal or family bleeding history  Psychiatric/Behavioral: Denies suicidal ideation, mood changes, confusion, nervousness, sleep disturbance and agitation    Physical Exam: Vitals:   12/05/20 1530  BP: 110/80  Pulse: 86  Temp: 98.4 F (36.9 C)  TempSrc: Oral  SpO2: 99%  Weight: 149 lb 3.2 oz (67.7 kg)    Body mass index is 27.29 kg/m.   Constitutional: NAD, calm, comfortable Eyes: PERRL, lids and conjunctivae normal ENMT: Mucous membranes are moist.  Neurologic: Grossly intact and nonfocal Psychiatric: Normal judgment and insight. Alert and oriented x 3. Normal mood.    Impression and Plan:  Acute cystitis with hematuria  - Plan: POCT urinalysis dipstick, Urinalysis, Urine Culture, sulfamethoxazole-trimethoprim (BACTRIM DS) 800-160 MG tablet -In office urine dipstick positive for blood, leukocytes, protein, nitrates. -Sent for urine analysis and culture. -Bactrim twice daily for 7 days.  She knows to contact us if no improvement.  Patient Instructions  -Nice seeing you today!!  -Start Bactrim DS twice daily for 7 days.      Chaya Jan, MD [facility]

## 2020-12-05 NOTE — Patient Instructions (Signed)
-  Nice seeing you today!!  -Start Bactrim DS twice daily for 7 days.

## 2020-12-06 LAB — URINALYSIS, ROUTINE W REFLEX MICROSCOPIC
Bilirubin Urine: NEGATIVE
Ketones, ur: NEGATIVE
Nitrite: NEGATIVE
Specific Gravity, Urine: 1.02 (ref 1.000–1.030)
Total Protein, Urine: 30 — AB
Urine Glucose: NEGATIVE
Urobilinogen, UA: 0.2 (ref 0.0–1.0)
pH: 8.5 — AB (ref 5.0–8.0)

## 2020-12-06 LAB — URINE CULTURE
MICRO NUMBER:: 12410100
SPECIMEN QUALITY:: ADEQUATE

## 2021-02-10 ENCOUNTER — Telehealth: Payer: Self-pay

## 2021-02-10 NOTE — Telephone Encounter (Signed)
-  Caller states she is having dizziness for 3 days. Better when she lays down. Head heaviness. Feels tired. No fever. -Caller states she is having dizziness for 3 days. Better when she lays down. Head heaviness. Feels tired. No fever.  02/10/2021 9:28:34 AM See PCP within 24 Hours Shon Baton, RN, [employer] Complies Caller Understands Yes  Referrals REFERRED TO PCP OFFICE  02/10/21 1428: No appt noted at this time. Pt states she is able to come see PCP this week. Appt made for 02/13/21.

## 2021-02-13 ENCOUNTER — Ambulatory Visit: Payer: 59 | Admitting: Adult Health

## 2021-02-13 ENCOUNTER — Encounter: Payer: Self-pay | Admitting: Adult Health

## 2021-02-13 VITALS — BP 126/86 | HR 99 | Temp 99.0°F | Ht 62.0 in | Wt 152.0 lb

## 2021-02-13 DIAGNOSIS — B349 Viral infection, unspecified: Secondary | ICD-10-CM

## 2021-02-13 DIAGNOSIS — R300 Dysuria: Secondary | ICD-10-CM | POA: Diagnosis not present

## 2021-02-13 LAB — POC URINALSYSI DIPSTICK (AUTOMATED)
Bilirubin, UA: NEGATIVE
Blood, UA: NEGATIVE
Glucose, UA: NEGATIVE
Ketones, UA: NEGATIVE
Leukocytes, UA: NEGATIVE
Nitrite, UA: NEGATIVE
Protein, UA: NEGATIVE
Spec Grav, UA: 1.025 (ref 1.010–1.025)
Urobilinogen, UA: 0.2 E.U./dL
pH, UA: 6 (ref 5.0–8.0)

## 2021-02-13 LAB — POC COVID19 BINAXNOW: SARS Coronavirus 2 Ag: NEGATIVE

## 2021-02-13 LAB — POCT INFLUENZA A/B
Influenza A, POC: NEGATIVE
Influenza B, POC: NEGATIVE

## 2021-02-13 NOTE — Progress Notes (Signed)
Subjective:    Patient ID: Sherri Crosby, female    DOB: [birth year], 45 y.o.   MRN: [id number]  HPI 45 year old female who  has a past medical history of Asthma, Chicken pox, and Positive PPD.  Presents to the office today with three days of dysuria and discolored urine. She denies frequency, urgency, low back pain or pressure.   This morning in school she felt warm like she had a fever and has developed a dry cough over the last 24 hours. She is vaccinated against covid and flu. 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     Social History   Socioeconomic History   Marital status: Married    Spouse name: Not on file   Number of children: Not on file   Years of education: Not on file   Highest education level: Not on file  Occupational History   Not on file  Tobacco Use   Smoking status: Never   Smokeless tobacco: Never  Substance and Sexual Activity   Alcohol use: No   Drug use: No   Sexual activity: Not on file  Other Topics Concern   Not on file  Social History Narrative   She is a Production designer, theatre/television/film at [employer]  For 12 years    Married    One child   Social Determinants of Corporate investment banker Strain: Not on file  Food Insecurity: Not on file  Transportation Needs: Not on file  Physical Activity: Not on file  Stress: Not on file  Social Connections: Not on file  Intimate Partner Violence: Not on file    History reviewed. No pertinent surgical history.  Family History  Problem Relation Age of Onset   Cirrhosis Father    Diabetes Father    CAD Mother    Diabetes Paternal Uncle    Diabetes Paternal Grandmother     Allergies  Allergen Reactions   Avocado     Current Outpatient Medications on File Prior to Visit  Medication Sig Dispense Refill   cetirizine  (ZYRTEC) 10 MG tablet Take 10 mg by mouth daily.     triamcinolone ointment (KENALOG) 0.5 % Apply 1 application topically 2 (two) times daily. 30 g 0   No current facility-administered medications on file prior to visit.    BP 126/86 (BP Location: Left Arm, Patient Position: Sitting, Cuff Size: Normal)   Pulse 99   Temp 99 F (37.2 C) (Oral)   Ht 5\' 2"  (1.575 m)   Wt 152 lb (68.9 kg)   SpO2 98%   BMI 27.80 kg/m       Objective:   Physical Exam Vitals and nursing note reviewed.  Constitutional:      Appearance: Normal appearance.  HENT:     Right Ear: Tympanic membrane, ear canal and external ear normal. There is no impacted cerumen.     Left Ear: Tympanic membrane, ear canal and external ear normal. There is no impacted cerumen.     Nose: Nose normal.     Mouth/Throat:     Mouth: Mucous membranes are moist.     Pharynx: Oropharynx is clear.  Cardiovascular:     Rate and Rhythm: Normal rate and regular rhythm.  Pulses: Normal pulses.     Heart sounds: Normal heart sounds.  Pulmonary:     Effort: Pulmonary effort is normal.     Breath sounds: Normal breath sounds.  Abdominal:     General: Abdomen is flat. Bowel sounds are normal.     Palpations: Abdomen is soft.     Tenderness: There is no right CVA tenderness or left CVA tenderness.  Musculoskeletal:        General: Normal range of motion.  Skin:    General: Skin is warm and dry.  Neurological:     General: No focal deficit present.     Mental Status: She is alert and oriented to person, place, and time.  Psychiatric:        Mood and Affect: Mood normal.        Behavior: Behavior normal.        Thought Content: Thought content normal.        Judgment: Judgment normal.      Assessment & Plan:  1. Viral illness -Seems viral rather than bacterial.  Advised rest, hydration, Tylenol/Motrin as needed for symptom relief.  Follow-up if no improvement in the next 3 to 4 days - POC COVID-19- negative  - POC  Influenza A/B - negative   2. Dysuria -UA negative for infection.  Possibly due to a degree of dehydration.  Encouraged to force fluids.  Follow-up as needed - POCT Urinalysis Dipstick (Automated)  [name], NP

## 2021-04-02 ENCOUNTER — Ambulatory Visit: Payer: 59 | Admitting: Adult Health

## 2021-04-02 ENCOUNTER — Encounter: Payer: Self-pay | Admitting: Adult Health

## 2021-04-02 VITALS — BP 122/60 | HR 89 | Temp 98.7°F | Ht 62.0 in | Wt 151.0 lb

## 2021-04-02 DIAGNOSIS — H8113 Benign paroxysmal vertigo, bilateral: Secondary | ICD-10-CM | POA: Diagnosis not present

## 2021-04-02 DIAGNOSIS — H579 Unspecified disorder of eye and adnexa: Secondary | ICD-10-CM

## 2021-04-02 MED ORDER — MECLIZINE HCL 25 MG PO TABS: 25.0000 mg | ORAL_TABLET | Freq: Three times a day (TID) | ORAL | 0 refills | Status: DC | PRN

## 2021-04-02 NOTE — Progress Notes (Signed)
Subjective:    Patient ID: Sherri Crosby, female    DOB: 1975/09/12, 46 y.o.   MRN: [id number]  HPI 46 year old female who  has a past medical history of Asthma, Chicken pox, and Positive PPD.  She presents to the office today for 2 separate issues.  First issue is that of dizziness.  She reports intermittent episodes of dizziness, feels as though the world is spinning around her.  This has been happening over the last 2 to 3 weeks.  Seems to be worse when change of positions and when laying down at night to go to bed.  Symptoms seem to quickly resolve  Her second issue is that of no sensation in her left eye.  Was recently seen by her eye doctor and diagnosed with dry eye syndrome.  She has been placing lubricating drops in her eye multiple times a day.  States "when put the drops in my left eye is almost like I do not feel them or they feel warm when I put them in my right drops feel cold." She denies blurred vision or headaches.  She does have a follow-up with her eye doctor later this week   Review of Systems See HPI   Past Medical History:  Diagnosis Date   Asthma    Chicken pox    Positive PPD    Likely from BCG vaccination     Social History   Socioeconomic History   Marital status: Married    Spouse name: Not on file   Number of children: Not on file   Years of education: Not on file   Highest education level: Not on file  Occupational History   Not on file  Tobacco Use   Smoking status: Never   Smokeless tobacco: Never  Substance and Sexual Activity   Alcohol use: No   Drug use: No   Sexual activity: Not on file  Other Topics Concern   Not on file  Social History Narrative   She is a Production designer, theatre/television/film at [employer]  For 12 years    Married    One child   Social Determinants of Corporate investment banker Strain: Not on file  Food Insecurity: Not on file  Transportation Needs: Not on file  Physical Activity: Not on file  Stress: Not on file  Social  Connections: Not on file  Intimate Partner Violence: Not on file    History reviewed. No pertinent surgical history.  Family History  Problem Relation Age of Onset   Cirrhosis Father    Diabetes Father    CAD Mother    Diabetes Paternal Uncle    Diabetes Paternal Grandmother     Allergies  Allergen Reactions   Avocado     Current Outpatient Medications on File Prior to Visit  Medication Sig Dispense Refill   cetirizine (ZYRTEC) 10 MG tablet Take 10 mg by mouth daily.     triamcinolone ointment (KENALOG) 0.5 % Apply 1 application topically 2 (two) times daily. 30 g 0   No current facility-administered medications on file prior to visit.    BP 122/60    Pulse 89    Temp 98.7 F (37.1 C) (Oral)    Ht 5\' 2"  (1.575 m)    Wt 151 lb (68.5 kg)    SpO2 99%    BMI 27.62 kg/m       Objective:   Physical Exam Vitals and nursing note reviewed.  Constitutional:  Appearance: Normal appearance.  Eyes:     General: Lids are normal. Lids are everted, no foreign bodies appreciated. Vision grossly intact.     Extraocular Movements:     Right eye: Nystagmus present.     Left eye: Nystagmus present.     Conjunctiva/sclera: Conjunctivae normal.     Comments: Mild horizontal nystagmus bilaterally.  She was not having any symptoms today of vertigo  Neurological:     Mental Status: She is alert.      Assessment & Plan:  1. Benign paroxysmal positional vertigo due to bilateral vestibular disorder -Symptoms seem to be consistent with BPPV.  Will prescribe Antivert to take as needed.  Follow-up if needed - meclizine (ANTIVERT) 25 MG tablet; Take 1 tablet (25 mg total) by mouth 3 (three) times daily as needed for dizziness.  Dispense: 30 tablet; Refill: 0  2. Abnormal sensation of eye -Unknown cause of the loss of sensation of her left eye.  She is going to talk to the eye doctor about it later on this week  [name], NP

## 2021-07-31 ENCOUNTER — Ambulatory Visit: Payer: 59 | Admitting: Adult Health

## 2021-09-12 ENCOUNTER — Encounter: Payer: Self-pay | Admitting: Adult Health

## 2021-09-12 ENCOUNTER — Ambulatory Visit (INDEPENDENT_AMBULATORY_CARE_PROVIDER_SITE_OTHER): Payer: Self-pay | Admitting: Adult Health

## 2021-09-12 ENCOUNTER — Telehealth: Payer: Self-pay | Admitting: Adult Health

## 2021-09-12 VITALS — BP 120/70 | HR 62 | Temp 98.5°F | Ht 62.0 in | Wt 157.0 lb

## 2021-09-12 DIAGNOSIS — Z1211 Encounter for screening for malignant neoplasm of colon: Secondary | ICD-10-CM

## 2021-09-12 DIAGNOSIS — E1169 Type 2 diabetes mellitus with other specified complication: Secondary | ICD-10-CM

## 2021-09-12 DIAGNOSIS — R5383 Other fatigue: Secondary | ICD-10-CM

## 2021-09-12 DIAGNOSIS — Z114 Encounter for screening for human immunodeficiency virus [HIV]: Secondary | ICD-10-CM

## 2021-09-12 DIAGNOSIS — Z0000 Encounter for general adult medical examination without abnormal findings: Secondary | ICD-10-CM

## 2021-09-12 DIAGNOSIS — H8113 Benign paroxysmal vertigo, bilateral: Secondary | ICD-10-CM

## 2021-09-12 DIAGNOSIS — Z1159 Encounter for screening for other viral diseases: Secondary | ICD-10-CM

## 2021-09-12 LAB — VITAMIN B12: Vitamin B-12: 174 pg/mL — ABNORMAL LOW (ref 211–911)

## 2021-09-12 LAB — COMPREHENSIVE METABOLIC PANEL
ALT: 32 U/L (ref 0–35)
AST: 48 U/L — ABNORMAL HIGH (ref 0–37)
Albumin: 4 g/dL (ref 3.5–5.2)
Alkaline Phosphatase: 60 U/L (ref 39–117)
BUN: 9 mg/dL (ref 6–23)
CO2: 29 mEq/L (ref 19–32)
Calcium: 9.4 mg/dL (ref 8.4–10.5)
Chloride: 102 mEq/L (ref 96–112)
Creatinine, Ser: 0.59 mg/dL (ref 0.40–1.20)
GFR: 108.08 mL/min (ref >60.00)
Glucose, Bld: 93 mg/dL (ref 70–99)
Potassium: 4.5 mEq/L (ref 3.5–5.1)
Sodium: 137 mEq/L (ref 135–145)
Total Bilirubin: 0.7 mg/dL (ref 0.2–1.2)
Total Protein: 8.3 g/dL (ref 6.0–8.3)

## 2021-09-12 LAB — LIPID PANEL
Cholesterol: 188 mg/dL (ref 0–200)
HDL: 56.6 mg/dL (ref >39.00)
LDL Cholesterol: 107 mg/dL — ABNORMAL HIGH (ref 0–99)
NonHDL: 131.31
Total CHOL/HDL Ratio: 3
Triglycerides: 121 mg/dL (ref 0.0–149.0)
VLDL: 24.2 mg/dL (ref 0.0–40.0)

## 2021-09-12 LAB — CBC WITH DIFFERENTIAL/PLATELET
Basophils Absolute: 0.1 10*3/uL (ref 0.0–0.1)
Basophils Relative: 0.8 % (ref 0.0–3.0)
Eosinophils Absolute: 0.3 10*3/uL (ref 0.0–0.7)
Eosinophils Relative: 4.7 % (ref 0.0–5.0)
HCT: 32.8 % — ABNORMAL LOW (ref 36.0–46.0)
Hemoglobin: 10.3 g/dL — ABNORMAL LOW (ref 12.0–15.0)
Lymphocytes Relative: 37.1 % (ref 12.0–46.0)
Lymphs Abs: 2.3 10*3/uL (ref 0.7–4.0)
MCHC: 31.5 g/dL (ref 30.0–36.0)
MCV: 69.4 fl — ABNORMAL LOW (ref 78.0–100.0)
Monocytes Absolute: 0.5 10*3/uL (ref 0.1–1.0)
Monocytes Relative: 8.1 % (ref 3.0–12.0)
Neutro Abs: 3.1 10*3/uL (ref 1.4–7.7)
Neutrophils Relative %: 49.3 % (ref 43.0–77.0)
Platelets: 229 10*3/uL (ref 150.0–400.0)
RBC: 4.73 Mil/uL (ref 3.87–5.11)
RDW: 16.8 % — ABNORMAL HIGH (ref 11.5–15.5)
WBC: 6.3 10*3/uL (ref 4.0–10.5)

## 2021-09-12 LAB — VITAMIN D 25 HYDROXY (VIT D DEFICIENCY, FRACTURES): VITD: 9.69 ng/mL — ABNORMAL LOW (ref 30.00–100.00)

## 2021-09-12 LAB — HEMOGLOBIN A1C: Hgb A1c MFr Bld: 6.5 % (ref 4.6–6.5)

## 2021-09-12 LAB — TSH: TSH: 3.8 u[IU]/mL (ref 0.35–5.50)

## 2021-09-12 MED ORDER — VITAMIN D (ERGOCALCIFEROL) 1.25 MG (50000 UNIT) PO CAPS: 50000.0000 [IU] | ORAL_CAPSULE | ORAL | 0 refills | Status: AC

## 2021-09-12 MED ORDER — VITAMIN B-12 1000 MCG PO TABS: 1000.0000 ug | ORAL_TABLET | Freq: Every day | ORAL | 3 refills | Status: AC

## 2021-09-12 MED ORDER — MECLIZINE HCL 25 MG PO TABS: 25.0000 mg | ORAL_TABLET | Freq: Three times a day (TID) | ORAL | 3 refills | Status: AC | PRN

## 2021-09-12 MED ORDER — METFORMIN HCL 500 MG PO TABS: 500.0000 mg | ORAL_TABLET | Freq: Two times a day (BID) | ORAL | 0 refills | Status: AC

## 2021-09-12 NOTE — Telephone Encounter (Signed)
Updated patient on her labs and   Vitamin D is low at 9   Vitamin B 12 is low at 179   And her A1c was 6.5   Will start her on Vitamin D 50,000 units, vitamin B12 supplement and Metformin   Follow up in 3 months

## 2021-09-12 NOTE — Patient Instructions (Signed)
It was great seeing you today   We will follow up with you regarding your lab work   Please let me know if you need anything   Someone will call you to schedule your GYN exam and Colonoscopy

## 2021-09-12 NOTE — Progress Notes (Signed)
Subjective:    Patient ID: Sherri Crosby, female    DOB: Jan 14, 1976, 46 y.o.   MRN: [id number]  HPI Patient presents for yearly preventative medicine examination. She is a pleasant 46 year old female who  has a past medical history of Asthma, Chicken pox, and Positive PPD.  Vertigo - intermittent episodes throughout the year. She will use Meclizine as needed   Fatigue - this is her biggest issue today. She reports that she is always feeling tired and has no motivation. . Denies signs of sleep apnea. She does work a lot. Denies depressive symptoms.   All immunizations and health maintenance protocols were reviewed with the patient and needed orders were placed.  Appropriate screening laboratory values were ordered for the patient including screening of hyperlipidemia, renal function and hepatic function.  Medication reconciliation,  past medical history, social history, problem list and allergies were reviewed in detail with the patient  Goals were established with regard to weight loss, exercise, and  diet in compliance with medications Wt Readings from Last 3 Encounters:  09/12/21 157 lb (71.2 kg)  04/02/21 151 lb (68.5 kg)  02/13/21 152 lb (68.9 kg)   She is due for her colonoscopy and GYN exam    Review of Systems  Constitutional: Negative.   HENT: Negative.    Eyes: Negative.   Respiratory: Negative.    Cardiovascular: Negative.   Gastrointestinal: Negative.   Endocrine: Negative.   Genitourinary: Negative.   Musculoskeletal: Negative.   Skin: Negative.   Allergic/Immunologic: Negative.   Neurological: Negative.   Hematological: Negative.   Psychiatric/Behavioral: Negative.     Past Medical History:  Diagnosis Date   Asthma    Chicken pox    Positive PPD    Likely from BCG vaccination     Social History   Socioeconomic History   Marital status: Married    Spouse name: Not on file   Number of children: Not on file   Years of education: Not on file    Highest education level: Not on file  Occupational History   Not on file  Tobacco Use   Smoking status: Never   Smokeless tobacco: Never  Substance and Sexual Activity   Alcohol use: No   Drug use: No   Sexual activity: Not on file  Other Topics Concern   Not on file  Social History Narrative   She is a Production designer, theatre/television/film at [employer]  For 12 years    Married    One child   Social Determinants of Corporate investment banker Strain: Not on file  Food Insecurity: Not on file  Transportation Needs: Not on file  Physical Activity: Not on file  Stress: Not on file  Social Connections: Not on file  Intimate Partner Violence: Not on file    No past surgical history on file.  Family History  Problem Relation Age of Onset   Cirrhosis Father    Diabetes Father    CAD Mother    Diabetes Paternal Uncle    Diabetes Paternal Grandmother     Allergies  Allergen Reactions   Avocado     No current outpatient medications on file prior to visit.   No current facility-administered medications on file prior to visit.    BP 120/70   Pulse 62   Temp 98.5 F (36.9 C)   Ht 5\' 2"  (1.575 m)   Wt 157 lb (71.2 kg)   SpO2 98%   BMI 28.72 kg/m  Objective:   Physical Exam Vitals and nursing note reviewed.  Constitutional:      General: She is not in acute distress.    Appearance: Normal appearance. She is well-developed. She is not ill-appearing.  HENT:     Head: Normocephalic and atraumatic.     Right Ear: Tympanic membrane, ear canal and external ear normal. There is no impacted cerumen.     Left Ear: Tympanic membrane, ear canal and external ear normal. There is no impacted cerumen.     Nose: Nose normal. No congestion or rhinorrhea.     Mouth/Throat:     Mouth: Mucous membranes are moist.     Pharynx: Oropharynx is clear. No oropharyngeal exudate or posterior oropharyngeal erythema.  Eyes:     General:        Right eye: No discharge.        Left eye: No discharge.      Extraocular Movements: Extraocular movements intact.     Conjunctiva/sclera: Conjunctivae normal.     Pupils: Pupils are equal, round, and reactive to light.  Neck:     Thyroid: No thyromegaly.     Vascular: No carotid bruit.     Trachea: No tracheal deviation.  Cardiovascular:     Rate and Rhythm: Normal rate and regular rhythm.     Pulses: Normal pulses.     Heart sounds: Normal heart sounds. No murmur heard.    No friction rub. No gallop.  Pulmonary:     Effort: Pulmonary effort is normal. No respiratory distress.     Breath sounds: Normal breath sounds. No stridor. No wheezing, rhonchi or rales.  Chest:     Chest wall: No tenderness.  Abdominal:     General: Abdomen is flat. Bowel sounds are normal. There is no distension.     Palpations: Abdomen is soft. There is no mass.     Tenderness: There is no abdominal tenderness. There is no right CVA tenderness, left CVA tenderness, guarding or rebound.     Hernia: No hernia is present.  Musculoskeletal:        General: No swelling, tenderness, deformity or signs of injury. Normal range of motion.     Cervical back: Normal range of motion and neck supple.     Right lower leg: No edema.     Left lower leg: No edema.  Lymphadenopathy:     Cervical: No cervical adenopathy.  Skin:    General: Skin is warm and dry.     Coloration: Skin is not jaundiced or pale.     Findings: No bruising, erythema, lesion or rash.  Neurological:     General: No focal deficit present.     Mental Status: She is alert and oriented to person, place, and time.     Cranial Nerves: No cranial nerve deficit.     Sensory: No sensory deficit.     Motor: No weakness.     Coordination: Coordination normal.     Gait: Gait normal.     Deep Tendon Reflexes: Reflexes normal.  Psychiatric:        Mood and Affect: Mood normal.        Behavior: Behavior normal.        Thought Content: Thought content normal.        Judgment: Judgment normal.       Assessment &  Plan:  1. Routine general medical examination at a health care facility - Follow up in one year or sooner if needed -  Encouraged heart healthy diet and exercise  - CBC with Differential/Platelet; Future - Comprehensive metabolic panel; Future - Hemoglobin A1c; Future - Lipid panel; Future - TSH; Future - Ambulatory referral to Gynecology - TSH - Lipid panel - Hemoglobin A1c - CBC with Differential/Platelet - Comprehensive metabolic panel  2. Encounter for screening for HIV  - HIV Antibody (routine testing w rflx); Future - HIV Antibody (routine testing w rflx)  3. Need for hepatitis C screening test  - Hep C Antibody; Future - Hep C Antibody  4. Colon cancer screening  - Ambulatory referral to Gastroenterology; Future  5. Benign paroxysmal positional vertigo due to bilateral vestibular disorder  - meclizine (ANTIVERT) 25 MG tablet; Take 1 tablet (25 mg total) by mouth 3 (three) times daily as needed for dizziness.  Dispense: 30 tablet; Refill: 3  6. Other fatigue  - CBC with Differential/Platelet; Future - Comprehensive metabolic panel; Future - Hemoglobin A1c; Future - TSH; Future - Vitamin B12; Future - VITAMIN D 25 Hydroxy (Vit-D Deficiency, Fractures); Future - VITAMIN D 25 Hydroxy (Vit-D Deficiency, Fractures) - Vitamin B12 - TSH - Lipid panel - Hemoglobin A1c - CBC with Differential/Platelet - Comprehensive metabolic panel  [name], NP

## 2021-09-15 LAB — HEPATITIS C ANTIBODY: Hepatitis C Ab: NONREACTIVE

## 2021-09-15 LAB — HIV ANTIBODY (ROUTINE TESTING W REFLEX): HIV 1&2 Ab, 4th Generation: NONREACTIVE

## 2021-09-30 ENCOUNTER — Encounter: Payer: Self-pay | Admitting: Nurse Practitioner

## 2022-10-14 ENCOUNTER — Encounter (INDEPENDENT_AMBULATORY_CARE_PROVIDER_SITE_OTHER): Payer: Self-pay

## 2022-10-28 ENCOUNTER — Ambulatory Visit (INDEPENDENT_AMBULATORY_CARE_PROVIDER_SITE_OTHER): Payer: BLUE CROSS/BLUE SHIELD | Admitting: Adult Health

## 2022-10-28 ENCOUNTER — Encounter: Payer: Self-pay | Admitting: Adult Health

## 2022-10-28 VITALS — BP 126/70 | HR 75 | Temp 98.3°F | Ht 59.0 in | Wt 144.0 lb

## 2022-10-28 DIAGNOSIS — E1169 Type 2 diabetes mellitus with other specified complication: Secondary | ICD-10-CM | POA: Diagnosis not present

## 2022-10-28 DIAGNOSIS — E538 Deficiency of other specified B group vitamins: Secondary | ICD-10-CM

## 2022-10-28 DIAGNOSIS — Z0000 Encounter for general adult medical examination without abnormal findings: Secondary | ICD-10-CM | POA: Diagnosis not present

## 2022-10-28 DIAGNOSIS — Z1211 Encounter for screening for malignant neoplasm of colon: Secondary | ICD-10-CM

## 2022-10-28 DIAGNOSIS — E559 Vitamin D deficiency, unspecified: Secondary | ICD-10-CM

## 2022-10-28 DIAGNOSIS — H8113 Benign paroxysmal vertigo, bilateral: Secondary | ICD-10-CM | POA: Diagnosis not present

## 2022-10-28 DIAGNOSIS — F419 Anxiety disorder, unspecified: Secondary | ICD-10-CM

## 2022-10-28 DIAGNOSIS — E785 Hyperlipidemia, unspecified: Secondary | ICD-10-CM

## 2022-10-28 DIAGNOSIS — F32A Depression, unspecified: Secondary | ICD-10-CM

## 2022-10-28 DIAGNOSIS — Z794 Long term (current) use of insulin: Secondary | ICD-10-CM

## 2022-10-28 MED ORDER — BUPROPION HCL ER (XL) 150 MG PO TB24: 150.0000 mg | ORAL_TABLET | Freq: Every day | ORAL | 0 refills | Status: DC

## 2022-10-28 NOTE — Patient Instructions (Signed)
It was great seeing you today   I am going to refer you to Gastroenterology for a colonoscopy and GYN for Pap and Mammogram   We will do blood work on you today   I have sent in a medication called Wellbutrin to help with anxiety and depression   Follow up in 3 months

## 2022-10-28 NOTE — Progress Notes (Signed)
Subjective:    Patient ID: Sherri Crosby, female    DOB: Jun 09, 1975, 47 y.o.   MRN: [id number]  HPI  Patient presents for yearly preventative medicine examination. She is a pleasant 47 year old female who  has a past medical history of Asthma, Chicken pox, and Positive PPD.  Vertigo - intermittent episodes throughout the year. She will use Meclizine as needed   Vitamin D Deficiency - was started on Vitamin D 50,000 units weekly in June 2023 x 3 months. She never followed up for repeat testing  Last vitamin D Lab Results  Component Value Date   VD25OH 9.69 (L) 09/12/2021   Vitamin B Deficiency - was started on OTC Vitamin B12 supplement, she is no longer taking this.  Lab Results  Component Value Date   VITAMINB12 174 (L) 09/12/2021   DM Type 2 - diagnosed in June 2023 with an A1c of 6.5 - she was started on Metformin 500 mg BID but never followed up after that.  Lab Results  Component Value Date   HGBA1C 6.5 09/12/2021   Hyperlipidemia - mildly elevated LDL - not currently on medication  Lab Results  Component Value Date   CHOL 188 09/12/2021   HDL 56.60 09/12/2021   LDLCALC 107 (H) 09/12/2021   TRIG 121.0 09/12/2021   CHOLHDL 3 09/12/2021   Anxiety and Depression - she reports hat over the last six months she has felt more anxious and depressed. She is not sure why this is but she feels like she is over thinking and small things will get her angry. She has thrown her phone against the wall multiple times in anger.   All immunizations and health maintenance protocols were reviewed with the patient and needed orders were placed.  Appropriate screening laboratory values were ordered for the patient including screening of hyperlipidemia, renal function and hepatic function.   Medication reconciliation,  past medical history, social history, problem list and allergies were reviewed in detail with the patient  Goals were established with regard to weight loss, exercise, and   diet in compliance with medications. She does not like to do anything active.   She is due for colon cancer screening - was referred to GI for colonoscopy last year but never went. She also needs GYN and mammogram done.    Review of Systems  Constitutional: Negative.   HENT: Negative.    Eyes: Negative.   Respiratory: Negative.    Cardiovascular: Negative.   Gastrointestinal: Negative.   Endocrine: Negative.   Genitourinary: Negative.   Musculoskeletal: Negative.   Skin: Negative.   Allergic/Immunologic: Negative.   Neurological: Negative.   Hematological: Negative.   Psychiatric/Behavioral:  Positive for agitation and decreased concentration. Negative for self-injury, sleep disturbance and suicidal ideas. The patient is nervous/anxious.    Past Medical History:  Diagnosis Date   Asthma    Chicken pox    Positive PPD    Likely from BCG vaccination     Social History   Socioeconomic History   Marital status: Married    Spouse name: Not on file   Number of children: Not on file   Years of education: Not on file   Highest education level: Not on file  Occupational History   Not on file  Tobacco Use   Smoking status: Never   Smokeless tobacco: Never  Substance and Sexual Activity   Alcohol use: No   Drug use: No   Sexual activity: Not on file  Other Topics  Concern   Not on file  Social History Narrative   She is a Production designer, theatre/television/film at [employer]  For 12 years    Married    One child   Social Determinants of Corporate investment banker Strain: Not on file  Food Insecurity: Not on file  Transportation Needs: Not on file  Physical Activity: Not on file  Stress: Not on file  Social Connections: Not on file  Intimate Partner Violence: Not on file    History reviewed. No pertinent surgical history.  Family History  Problem Relation Age of Onset   Cirrhosis Father    Diabetes Father    CAD Mother    Diabetes Paternal Uncle    Diabetes Paternal Grandmother      Allergies  Allergen Reactions   Avocado     Current Outpatient Medications on File Prior to Visit  Medication Sig Dispense Refill   meclizine (ANTIVERT) 25 MG tablet Take 1 tablet (25 mg total) by mouth 3 (three) times daily as needed for dizziness. 30 tablet 3   metFORMIN (GLUCOPHAGE) 500 MG tablet Take 1 tablet (500 mg total) by mouth 2 (two) times daily with a meal. 180 tablet 0   No current facility-administered medications on file prior to visit.    BP 126/70   Pulse 75   Temp 98.3 F (36.8 C) (Oral)   Ht 4\' 11"  (1.499 m)   Wt 144 lb (65.3 kg)   LMP 10/12/2022   SpO2 97%   BMI 29.08 kg/m       Objective:   Physical Exam Vitals and nursing note reviewed.  Constitutional:      General: She is not in acute distress.    Appearance: Normal appearance. She is obese. She is not ill-appearing.  HENT:     Head: Normocephalic and atraumatic.     Right Ear: Tympanic membrane, ear canal and external ear normal. There is no impacted cerumen.     Left Ear: Tympanic membrane, ear canal and external ear normal. There is no impacted cerumen.     Nose: Nose normal. No congestion or rhinorrhea.     Mouth/Throat:     Mouth: Mucous membranes are moist.     Pharynx: Oropharynx is clear.  Eyes:     Extraocular Movements: Extraocular movements intact.     Conjunctiva/sclera: Conjunctivae normal.     Pupils: Pupils are equal, round, and reactive to light.  Neck:     Vascular: No carotid bruit.  Cardiovascular:     Rate and Rhythm: Normal rate and regular rhythm.     Pulses: Normal pulses.     Heart sounds: No murmur heard.    No friction rub. No gallop.  Pulmonary:     Effort: Pulmonary effort is normal.     Breath sounds: Normal breath sounds.  Abdominal:     General: Abdomen is flat. Bowel sounds are normal. There is no distension.     Palpations: Abdomen is soft. There is no mass.     Tenderness: There is no abdominal tenderness. There is no guarding or rebound.      Hernia: No hernia is present.  Musculoskeletal:        General: Normal range of motion.     Cervical back: Normal range of motion and neck supple.  Lymphadenopathy:     Cervical: No cervical adenopathy.  Skin:    General: Skin is warm and dry.     Capillary Refill: Capillary refill takes less than 2  seconds.  Neurological:     General: No focal deficit present.     Mental Status: She is alert and oriented to person, place, and time.  Psychiatric:        Mood and Affect: Mood normal.        Behavior: Behavior normal.        Thought Content: Thought content normal.        Judgment: Judgment normal.        Assessment & Plan:  1. Routine general medical examination at a health care facility Today patient counseled on age appropriate routine health concerns for screening and prevention, each reviewed and up to date or declined. Immunizations reviewed and up to date or declined. Labs ordered and reviewed. Risk factors for depression reviewed and negative. Hearing function and visual acuity are intact. ADLs screened and addressed as needed. Functional ability and level of safety reviewed and appropriate. Education, counseling and referrals performed based on assessed risks today. Patient provided with a copy of personalized plan for preventive services.  - Ambulatory referral to Obstetrics / Gynecology - Encouraged heart healthy diet and exercise   2. Type 2 diabetes mellitus with other specified complication, with long-term current use of insulin (HCC) - Likely need to go back on Metformin  - Follow up in 3 months  - CBC with Differential/Platelet; Future - Comprehensive metabolic panel; Future - Lipid panel; Future - TSH; Future - Hemoglobin A1c; Future - Microalbumin/Creatinine Ratio, Urine - VITAMIN D 25 Hydroxy (Vit-D Deficiency, Fractures); Future - Vitamin B12; Future  3. Colon cancer screening  - Ambulatory referral to Gastroenterology  4. Benign paroxysmal positional  vertigo due to bilateral vestibular disorder - Continue with Meclizine as needed   5. Vitamin D deficiency  - VITAMIN D 25 Hydroxy (Vit-D Deficiency, Fractures); Future  6. Vitamin B12 deficiency - Vitamin B12; Future  7. Anxiety and depression - Start on Wellbutrin 150 mg XR.  - Will have her follow up in 3 months when we follow up about diabetes  - CBC with Differential/Platelet; Future - Comprehensive metabolic panel; Future - Lipid panel; Future - TSH; Future - Hemoglobin A1c; Future - buPROPion (WELLBUTRIN XL) 150 MG 24 hr tablet; Take 1 tablet (150 mg total) by mouth daily.  Dispense: 90 tablet; Refill: 0  8. Hyperlipidemia associated with type 2 diabetes mellitus (HCC) - Will see what lipid panel is and decide on statin  - CBC with Differential/Platelet; Future - Comprehensive metabolic panel; Future - Lipid panel; Future - TSH; Future - Hemoglobin A1c; Future  [name], NP

## 2022-10-29 ENCOUNTER — Other Ambulatory Visit (INDEPENDENT_AMBULATORY_CARE_PROVIDER_SITE_OTHER): Payer: BLUE CROSS/BLUE SHIELD

## 2022-10-29 ENCOUNTER — Other Ambulatory Visit: Payer: Self-pay | Admitting: Adult Health

## 2022-10-29 DIAGNOSIS — F419 Anxiety disorder, unspecified: Secondary | ICD-10-CM | POA: Diagnosis not present

## 2022-10-29 DIAGNOSIS — E785 Hyperlipidemia, unspecified: Secondary | ICD-10-CM

## 2022-10-29 DIAGNOSIS — E538 Deficiency of other specified B group vitamins: Secondary | ICD-10-CM

## 2022-10-29 DIAGNOSIS — E1169 Type 2 diabetes mellitus with other specified complication: Secondary | ICD-10-CM | POA: Diagnosis not present

## 2022-10-29 DIAGNOSIS — F32A Depression, unspecified: Secondary | ICD-10-CM | POA: Diagnosis not present

## 2022-10-29 DIAGNOSIS — E559 Vitamin D deficiency, unspecified: Secondary | ICD-10-CM

## 2022-10-29 DIAGNOSIS — Z794 Long term (current) use of insulin: Secondary | ICD-10-CM | POA: Diagnosis not present

## 2022-10-29 LAB — COMPREHENSIVE METABOLIC PANEL
ALT: 11 U/L (ref 0–35)
AST: 17 U/L (ref 0–37)
Albumin: 3.9 g/dL (ref 3.5–5.2)
Alkaline Phosphatase: 52 U/L (ref 39–117)
BUN: 11 mg/dL (ref 6–23)
CO2: 27 mEq/L (ref 19–32)
Calcium: 9.4 mg/dL (ref 8.4–10.5)
Chloride: 102 mEq/L (ref 96–112)
Creatinine, Ser: 0.56 mg/dL (ref 0.40–1.20)
GFR: 108.59 mL/min (ref >60.00)
Glucose, Bld: 76 mg/dL (ref 70–99)
Potassium: 3.7 mEq/L (ref 3.5–5.1)
Sodium: 135 mEq/L (ref 135–145)
Total Bilirubin: 0.5 mg/dL (ref 0.2–1.2)
Total Protein: 7.8 g/dL (ref 6.0–8.3)

## 2022-10-29 LAB — CBC WITH DIFFERENTIAL/PLATELET
Basophils Absolute: 0 10*3/uL (ref 0.0–0.1)
Basophils Relative: 0.6 % (ref 0.0–3.0)
Eosinophils Absolute: 0.2 10*3/uL (ref 0.0–0.7)
Eosinophils Relative: 3.5 % (ref 0.0–5.0)
HCT: 33.1 % — ABNORMAL LOW (ref 36.0–46.0)
Hemoglobin: 10 g/dL — ABNORMAL LOW (ref 12.0–15.0)
Lymphocytes Relative: 39.9 % (ref 12.0–46.0)
Lymphs Abs: 2.6 10*3/uL (ref 0.7–4.0)
MCHC: 30.3 g/dL (ref 30.0–36.0)
MCV: 68.5 fl — ABNORMAL LOW (ref 78.0–100.0)
Monocytes Absolute: 0.6 10*3/uL (ref 0.1–1.0)
Monocytes Relative: 8.6 % (ref 3.0–12.0)
Neutro Abs: 3.1 10*3/uL (ref 1.4–7.7)
Neutrophils Relative %: 47.4 % (ref 43.0–77.0)
Platelets: 236 10*3/uL (ref 150.0–400.0)
RBC: 4.84 Mil/uL (ref 3.87–5.11)
RDW: 17.2 % — ABNORMAL HIGH (ref 11.5–15.5)
WBC: 6.6 10*3/uL (ref 4.0–10.5)

## 2022-10-29 LAB — LIPID PANEL
Cholesterol: 211 mg/dL — ABNORMAL HIGH (ref 0–200)
HDL: 65.4 mg/dL (ref >39.00)
LDL Cholesterol: 127 mg/dL — ABNORMAL HIGH (ref 0–99)
NonHDL: 145.34
Total CHOL/HDL Ratio: 3
Triglycerides: 93 mg/dL (ref 0.0–149.0)
VLDL: 18.6 mg/dL (ref 0.0–40.0)

## 2022-10-29 LAB — VITAMIN B12: Vitamin B-12: 164 pg/mL — ABNORMAL LOW (ref 211–911)

## 2022-10-29 LAB — TSH: TSH: 4.28 u[IU]/mL (ref 0.35–5.50)

## 2022-10-29 LAB — VITAMIN D 25 HYDROXY (VIT D DEFICIENCY, FRACTURES): VITD: 14.67 ng/mL — ABNORMAL LOW (ref 30.00–100.00)

## 2022-10-29 LAB — HEMOGLOBIN A1C: Hgb A1c MFr Bld: 6.2 % (ref 4.6–6.5)

## 2022-10-29 MED ORDER — ATORVASTATIN CALCIUM 10 MG PO TABS
10.0000 mg | ORAL_TABLET | Freq: Every day | ORAL | 3 refills | Status: AC
Start: 2022-10-29 — End: ?

## 2022-10-29 MED ORDER — VITAMIN D (ERGOCALCIFEROL) 1.25 MG (50000 UNIT) PO CAPS
50000.0000 [IU] | ORAL_CAPSULE | ORAL | 1 refills | Status: AC
Start: 2022-10-29 — End: 2023-01-27

## 2023-01-23 ENCOUNTER — Other Ambulatory Visit: Payer: Self-pay | Admitting: Adult Health

## 2023-01-23 DIAGNOSIS — F419 Anxiety disorder, unspecified: Secondary | ICD-10-CM

## 2023-06-07 ENCOUNTER — Other Ambulatory Visit (HOSPITAL_COMMUNITY)
Admission: RE | Admit: 2023-06-07 | Discharge: 2023-06-07 | Disposition: A | Discharge status: Home / Self Care | Source: Ambulatory Visit | Attending: Obstetrics & Gynecology | Admitting: Obstetrics & Gynecology

## 2023-06-07 ENCOUNTER — Ambulatory Visit: Payer: BLUE CROSS/BLUE SHIELD | Admitting: Obstetrics

## 2023-06-07 ENCOUNTER — Encounter: Payer: Self-pay | Admitting: Obstetrics

## 2023-06-07 VITALS — BP 138/82 | HR 74 | Wt 148.1 lb

## 2023-06-07 DIAGNOSIS — Z01419 Encounter for gynecological examination (general) (routine) without abnormal findings: Secondary | ICD-10-CM

## 2023-06-07 DIAGNOSIS — N898 Other specified noninflammatory disorders of vagina: Secondary | ICD-10-CM

## 2023-06-07 DIAGNOSIS — Z1239 Encounter for other screening for malignant neoplasm of breast: Secondary | ICD-10-CM

## 2023-06-07 DIAGNOSIS — Z113 Encounter for screening for infections with a predominantly sexual mode of transmission: Secondary | ICD-10-CM

## 2023-06-07 DIAGNOSIS — Z1211 Encounter for screening for malignant neoplasm of colon: Secondary | ICD-10-CM

## 2023-06-07 NOTE — Progress Notes (Signed)
 Subjective:        Sherri Crosby is a 48 y.o. female here for a routine exam.  Current complaints: Vaginal discharge.    Personal health questionnaire:  Is patient Ashkenazi Jewish, have a family history of breast and/or ovarian cancer: no Is there a family history of uterine cancer diagnosed at age < 29, gastrointestinal cancer, urinary tract cancer, family member who is a Personnel officer syndrome-associated carrier: no Is the patient overweight and hypertensive, family history of diabetes, personal history of gestational diabetes, preeclampsia or PCOS: no Is patient over 58, have PCOS,  family history of premature CHD under age 87, diabetes, smoke, have hypertension or peripheral artery disease:  no At any time, has a partner hit, kicked or otherwise hurt or frightened you?: no Over the past 2 weeks, have you felt down, depressed or hopeless?: no Over the past 2 weeks, have you felt little interest or pleasure in doing things?:no   Gynecologic History Patient's last menstrual period was 05/29/2023 (exact date). Contraception: none Last Pap: unknown. Results were: normal Last mammogram: none. Results were: none  Obstetric History OB History  Gravida Para Term Preterm AB Living  2 1 1  1 1   SAB IAB Ectopic Multiple Live Births  1        # Outcome Date GA Lbr Len/2nd Weight Sex Type Anes PTL Lv  2 SAB           1 Term             Past Medical History:  Diagnosis Date   Asthma    Chicken pox    Positive PPD    Likely from BCG vaccination     History reviewed. No pertinent surgical history.   Current Outpatient Medications:    atorvastatin (LIPITOR) 10 MG tablet, Take 1 tablet (10 mg total) by mouth daily., Disp: 90 tablet, Rfl: 3   buPROPion (WELLBUTRIN XL) 150 MG 24 hr tablet, TAKE 1 TABLET BY MOUTH EVERY DAY, Disp: 90 tablet, Rfl: 0   meclizine (ANTIVERT) 25 MG tablet, Take 1 tablet (25 mg total) by mouth 3 (three) times daily as needed for dizziness., Disp: 30 tablet, Rfl:  3   metFORMIN (GLUCOPHAGE) 500 MG tablet, Take 1 tablet (500 mg total) by mouth 2 (two) times daily with a meal., Disp: 180 tablet, Rfl: 0 Allergies  Allergen Reactions   Avocado     Social History   Tobacco Use   Smoking status: Never   Smokeless tobacco: Never  Substance Use Topics   Alcohol use: No    Family History  Problem Relation Age of Onset   Cirrhosis Father    Diabetes Father    CAD Mother    Diabetes Paternal Uncle    Diabetes Paternal Grandmother       Review of Systems  Constitutional: negative for fatigue and weight loss Respiratory: negative for cough and wheezing Cardiovascular: negative for chest pain, fatigue and palpitations Gastrointestinal: negative for abdominal pain and change in bowel habits Musculoskeletal:negative for myalgias Neurological: negative for gait problems and tremors Behavioral/Psych: negative for abusive relationship, depression Endocrine: negative for temperature intolerance    Genitourinary positive for vaginal discharge.  :negative for abnormal menstrual periods, genital lesions, hot flashes, sexual problems  Integument/breast: negative for breast lump, breast tenderness, nipple discharge and skin lesion(s)    Objective:       BP 138/82   Pulse 74   Wt 148 lb 1.6 oz (67.2 kg)   LMP 05/29/2023 (  Exact Date)   BMI 29.91 kg/m  General:   Alert and no distress  Skin:   no rash or abnormalities  Lungs:   clear to auscultation bilaterally  Heart:   regular rate and rhythm, S1, S2 normal, no murmur, click, rub or gallop  Breasts:   normal without suspicious masses, skin or nipple changes or axillary nodes  Abdomen:  normal findings: no organomegaly, soft, non-tender and no hernia  Pelvis:  External genitalia: normal general appearance Urinary system: urethral meatus normal and bladder without fullness, nontender Vaginal: normal without tenderness, induration or masses Cervix: normal appearance Adnexa: normal bimanual  exam Uterus: anteverted and non-tender, normal size   Lab Review Urine pregnancy test Labs reviewed yes Radiologic studies reviewed yes  I have spent a total of 20 minutes of face-to-face time, excluding clinical staff time, reviewing notes and preparing to see patient, ordering tests and/or medications, and counseling the patient.    Assessment:    1. Encounter for gynecological examination with Papanicolaou smear of cervix (Primary) Rx: - Cytology - PAP( [place])  2. Vaginal discharge Rx: - Cervicovaginal ancillary only( [place])  3. Screening examination for STD (sexually transmitted disease) Rx: - HIV antibody (with reflex) - RPR - Hepatitis C Antibody - Hepatitis B Surface AntiGEN  4. Screening breast examination Rx: - MM 3D SCREENING MAMMOGRAM BILATERAL BREAST; Future  5. Screening for colon cancer Rx: - Ambulatory referral to Gastroenterology      Plan:    Education reviewed: calcium supplements, depression evaluation, low fat, low cholesterol diet, safe sex/STD prevention, self breast exams, and weight bearing exercise. Mammogram ordered. Colonoscopy ordered     Orders Placed This Encounter  Procedures   MM 3D SCREENING MAMMOGRAM BILATERAL BREAST    INS: [employer]  #:[id number] PF: UNKNOWN DIAG: ANNUAL - no known probs, per dr. Alease Medina INDIV OR FAM HX OF BR CA: UNKNOWN HX OF BR IMPLANTS OR REDUCTIONS: UNKNOWN SPECIAL NEEDS: NONE PT AWARE OF $75 CX/NS FEE SW Dr. Isidore Moos / [facility]    Standing Status:   Future    Expiration Date:   06/06/2024    Reason for Exam (SYMPTOM  OR DIAGNOSIS REQUIRED):   screening    Is the patient pregnant?:   No    Preferred imaging location?:   [facility]   HIV antibody (with reflex)   RPR   Hepatitis C Antibody   Hepatitis B Surface AntiGEN   Ambulatory referral to Gastroenterology    Referral Priority:   Routine    Referral Type:   Consultation    Referral Reason:   Specialty Services Required    Number of Visits  Requested:   1    Brock Bad, MD, FACOG Attending Obstetrician & Gynecologist, [facility] for [facility], [facility], [place] 06/07/2023

## 2023-06-07 NOTE — Progress Notes (Signed)
 Pt. Presents tor Annual. Pt has no questions or concerns at this time.

## 2023-06-08 LAB — RPR: RPR Ser Ql: NONREACTIVE

## 2023-06-08 LAB — HIV ANTIBODY (ROUTINE TESTING W REFLEX): HIV Screen 4th Generation wRfx: NONREACTIVE

## 2023-06-08 LAB — HEPATITIS B SURFACE ANTIGEN: Hepatitis B Surface Ag: NEGATIVE

## 2023-06-08 LAB — HEPATITIS C ANTIBODY: Hep C Virus Ab: NONREACTIVE

## 2023-06-09 LAB — CERVICOVAGINAL ANCILLARY ONLY
Bacterial Vaginitis (gardnerella): POSITIVE — AB
Candida Glabrata: NEGATIVE
Candida Vaginitis: NEGATIVE
Chlamydia: POSITIVE — AB
Comment: NEGATIVE
Comment: NEGATIVE
Comment: NEGATIVE
Comment: NEGATIVE
Comment: NEGATIVE
Comment: NORMAL
Neisseria Gonorrhea: NEGATIVE
Trichomonas: NEGATIVE

## 2023-06-10 ENCOUNTER — Other Ambulatory Visit: Payer: Self-pay | Admitting: Obstetrics

## 2023-06-10 ENCOUNTER — Telehealth: Payer: Self-pay

## 2023-06-10 DIAGNOSIS — A749 Chlamydial infection, unspecified: Secondary | ICD-10-CM

## 2023-06-10 DIAGNOSIS — B9689 Other specified bacterial agents as the cause of diseases classified elsewhere: Secondary | ICD-10-CM

## 2023-06-10 MED ORDER — METRONIDAZOLE 500 MG PO TABS
500.0000 mg | ORAL_TABLET | Freq: Two times a day (BID) | ORAL | 2 refills | Status: AC
Start: 2023-06-10 — End: ?

## 2023-06-10 MED ORDER — DOXYCYCLINE HYCLATE 100 MG PO CAPS: 100.0000 mg | ORAL_CAPSULE | Freq: Two times a day (BID) | ORAL | 0 refills | Status: AC

## 2023-06-10 NOTE — Telephone Encounter (Signed)
  TC to pt about results and Rx, informed to tell partner and have him treated. Pt requested my chart message with info on where husband can get treatment. Informed that after treatment we will want them both to abstain from intercourse for 7 days following treatment.

## 2023-06-10 NOTE — Progress Notes (Signed)
 TC to pt about results and Rx, informed to tell partner and have him treated. Pt requested my chart message with info on where husband can get treatment. Informed that after treatment we will want them both to abstain from intercourse for 7 days following treatment.

## 2023-06-17 LAB — CYTOLOGY - PAP
Chlamydia: POSITIVE — AB
Comment: NEGATIVE
Comment: NEGATIVE
Comment: NORMAL
Diagnosis: NEGATIVE
Diagnosis: REACTIVE
High risk HPV: NEGATIVE
Neisseria Gonorrhea: NEGATIVE

## 2023-06-18 ENCOUNTER — Encounter: Payer: Self-pay | Admitting: Family Medicine

## 2023-06-21 ENCOUNTER — Ambulatory Visit

## 2023-07-02 ENCOUNTER — Ambulatory Visit

## 2023-07-20 ENCOUNTER — Ambulatory Visit
Admission: RE | Admit: 2023-07-20 | Discharge: 2023-07-20 | Disposition: A | Discharge status: Home / Self Care | Source: Ambulatory Visit | Attending: Obstetrics | Admitting: Obstetrics

## 2023-07-20 DIAGNOSIS — Z1239 Encounter for other screening for malignant neoplasm of breast: Secondary | ICD-10-CM

## 2023-09-11 ENCOUNTER — Other Ambulatory Visit: Payer: Self-pay | Admitting: Adult Health

## 2023-09-11 DIAGNOSIS — E559 Vitamin D deficiency, unspecified: Secondary | ICD-10-CM

## 2023-11-29 ENCOUNTER — Other Ambulatory Visit: Payer: Self-pay | Admitting: Adult Health

## 2023-11-29 DIAGNOSIS — E1169 Type 2 diabetes mellitus with other specified complication: Secondary | ICD-10-CM

## 2023-11-29 DIAGNOSIS — E559 Vitamin D deficiency, unspecified: Secondary | ICD-10-CM

## 2023-12-01 NOTE — Telephone Encounter (Signed)
 Patient need to schedule CPE or more refills.
